# Patient Record
Sex: Male | Born: 1965
Health system: Southern US, Community
[De-identification: ages and names within clinical notes are randomized; demographics above are authoritative.]

## PROBLEM LIST (undated history)

## (undated) DIAGNOSIS — E785 Hyperlipidemia, unspecified: Secondary | ICD-10-CM

## (undated) DIAGNOSIS — M1990 Unspecified osteoarthritis, unspecified site: Secondary | ICD-10-CM

## (undated) DIAGNOSIS — T7840XA Allergy, unspecified, initial encounter: Secondary | ICD-10-CM

## (undated) DIAGNOSIS — K219 Gastro-esophageal reflux disease without esophagitis: Secondary | ICD-10-CM

## (undated) DIAGNOSIS — R51 Headache: Secondary | ICD-10-CM

## (undated) DIAGNOSIS — M21379 Foot drop, unspecified foot: Secondary | ICD-10-CM

## (undated) DIAGNOSIS — F909 Attention-deficit hyperactivity disorder, unspecified type: Secondary | ICD-10-CM

## (undated) HISTORY — DX: Headache: R51

## (undated) HISTORY — DX: Foot drop, unspecified foot: M21.379

## (undated) HISTORY — PX: EYE SURGERY: SHX253

## (undated) HISTORY — DX: Attention-deficit hyperactivity disorder, unspecified type: F90.9

## (undated) HISTORY — PX: FRACTURE SURGERY: SHX138

## (undated) HISTORY — DX: Allergy, unspecified, initial encounter: T78.40XA

## (undated) HISTORY — DX: Hyperlipidemia, unspecified: E78.5

## (undated) HISTORY — DX: Unspecified osteoarthritis, unspecified site: M19.90

## (undated) HISTORY — DX: Gastro-esophageal reflux disease without esophagitis: K21.9

---

## 2004-09-07 ENCOUNTER — Ambulatory Visit: Payer: Self-pay | Admitting: Family Medicine

## 2005-01-24 ENCOUNTER — Ambulatory Visit: Payer: Self-pay | Admitting: Internal Medicine

## 2005-04-18 ENCOUNTER — Ambulatory Visit: Payer: Self-pay | Admitting: Internal Medicine

## 2005-11-27 ENCOUNTER — Ambulatory Visit: Payer: Self-pay | Admitting: Internal Medicine

## 2010-07-22 DIAGNOSIS — M21379 Foot drop, unspecified foot: Secondary | ICD-10-CM

## 2010-07-22 HISTORY — DX: Foot drop, unspecified foot: M21.379

## 2011-04-19 ENCOUNTER — Telehealth: Payer: Self-pay | Admitting: Internal Medicine

## 2011-04-19 ENCOUNTER — Encounter: Payer: Self-pay | Admitting: Internal Medicine

## 2011-04-19 ENCOUNTER — Ambulatory Visit (INDEPENDENT_AMBULATORY_CARE_PROVIDER_SITE_OTHER): Payer: BC Managed Care – PPO | Admitting: Internal Medicine

## 2011-04-19 DIAGNOSIS — Z0000 Encounter for general adult medical examination without abnormal findings: Secondary | ICD-10-CM | POA: Insufficient documentation

## 2011-04-19 DIAGNOSIS — F909 Attention-deficit hyperactivity disorder, unspecified type: Secondary | ICD-10-CM | POA: Insufficient documentation

## 2011-04-19 DIAGNOSIS — Z136 Encounter for screening for cardiovascular disorders: Secondary | ICD-10-CM

## 2011-04-19 NOTE — Patient Instructions (Addendum)
Came back fasting: A1C -- hyperglycemia FLP  --dx v70

## 2011-04-19 NOTE — Assessment & Plan Note (Addendum)
If at some point likes me to RF meds , we will need to get dx documentation

## 2011-04-19 NOTE — Assessment & Plan Note (Addendum)
Td 2012 along w/ many other shots in preparation to a trip to [place] (hep A and B) , yellow fever,  malaria prophylaxis Rx.   Brought labs from 6-12: CBC normal, CBG 112, BMP otherwise normal, LFTs-TSH-vit D--- normal Plan: FLP A1C Brother dx w/  Colon polyps -- refer to GI Diet-exercise discussed EKG wnl

## 2011-04-19 NOTE — Progress Notes (Signed)
  Subjective:    Patient ID: Steven Avery, male    DOB: 1965-09-01, 45 y.o.   MRN: [id number]  HPI CPX New patient, last seen more than 5 years ago. Doing well, separated from wife, had some anxiety issues, went to see a counselor last year, diagnosed with ADD (per pt), prescribed Wellbutrin, doing well.  Past Medical History  Diagnosis Date  . Mild hyperlipidemia   . Foot drop 2012    Left, resolving   . ADD (attention deficit disorder with hyperactivity)     ? of , dx 2011, on wellbutrin   Past Surgical History  Procedure Date  . Fracture surgery     L arm, L ankle (remotely)  . Eye surgery     ?strabismus    History   Social History  . Marital Status: Married    Spouse Name: N/A    Number of Children: 2  . Years of Education: N/A   Occupational History  . engineer     Social History Main Topics  . Smoking status: Never Smoker   . Smokeless tobacco: Never Used  . Alcohol Use: Yes     socially   . Drug Use: No  . Sexually Active: Not on file   Other Topics Concern  . Not on file   Social History Narrative   Separated from wife, 2011( kids 18-20)----Diet: healthy most of the time---exercise: on-off    Family History  Problem Relation Age of Onset  . Diabetes Neg Hx   . Hypertension Neg Hx   . Coronary artery disease Neg Hx   . Colon cancer Neg Hx   . Hyperlipidemia      M  . Prostate cancer Neg Hx   . Colon polyps      brother at age 74      Review of Systems  Respiratory: Negative for cough and shortness of breath.   Cardiovascular: Negative for chest pain and leg swelling.  Genitourinary: Negative for dysuria and hematuria.       Objective:   Physical Exam  Constitutional: He is oriented to person, place, and time. He appears well-developed and well-nourished. No distress.  HENT:  Head: Normocephalic and atraumatic.  Neck: No thyromegaly present.       Nl carotid pulse   Cardiovascular: Normal rate, regular rhythm and normal heart  sounds.   No murmur heard. Pulmonary/Chest: Effort normal and breath sounds normal. No respiratory distress. He has no wheezes. He has no rales.  Abdominal: Soft. Bowel sounds are normal. He exhibits no distension. There is no tenderness. There is no rebound and no guarding.  Musculoskeletal: He exhibits no edema.  Neurological: He is alert and oriented to person, place, and time.  Skin: Skin is warm and dry. He is not diaphoretic.  Psychiatric: He has a normal mood and affect. His behavior is normal. Judgment and thought content normal.          Assessment & Plan:

## 2011-04-19 NOTE — Telephone Encounter (Signed)
Patient has a question about a MMR booster, please call him

## 2011-04-22 ENCOUNTER — Other Ambulatory Visit: Payer: Self-pay | Admitting: Internal Medicine

## 2011-04-22 DIAGNOSIS — Z0000 Encounter for general adult medical examination without abnormal findings: Secondary | ICD-10-CM

## 2011-04-23 ENCOUNTER — Encounter: Payer: Self-pay | Admitting: Gastroenterology

## 2011-04-23 ENCOUNTER — Other Ambulatory Visit (INDEPENDENT_AMBULATORY_CARE_PROVIDER_SITE_OTHER): Payer: BC Managed Care – PPO

## 2011-04-23 DIAGNOSIS — Z0000 Encounter for general adult medical examination without abnormal findings: Secondary | ICD-10-CM

## 2011-04-23 LAB — LIPID PANEL: Cholesterol: 251 mg/dL — ABNORMAL HIGH (ref 0–200)

## 2011-04-23 NOTE — Progress Notes (Signed)
Labs only

## 2011-04-25 LAB — HEMOGLOBIN A1C: Hgb A1c MFr Bld: 5 % (ref 4.6–6.5)

## 2011-05-15 ENCOUNTER — Ambulatory Visit (INDEPENDENT_AMBULATORY_CARE_PROVIDER_SITE_OTHER): Payer: BC Managed Care – PPO | Admitting: Internal Medicine

## 2011-05-15 ENCOUNTER — Encounter: Payer: Self-pay | Admitting: Internal Medicine

## 2011-05-15 VITALS — BP 162/74 | HR 60 | Temp 97.7°F | Ht 72.0 in | Wt 169.4 lb

## 2011-05-15 DIAGNOSIS — J029 Acute pharyngitis, unspecified: Secondary | ICD-10-CM

## 2011-05-15 DIAGNOSIS — J069 Acute upper respiratory infection, unspecified: Secondary | ICD-10-CM

## 2011-05-15 DIAGNOSIS — R05 Cough: Secondary | ICD-10-CM

## 2011-05-15 DIAGNOSIS — R059 Cough, unspecified: Secondary | ICD-10-CM

## 2011-05-15 NOTE — Patient Instructions (Signed)
Rest, fluids , tylenol For cough, take Mucinex DM twice a day as needed  Continue your allergy meds and sinus lavages Call if no better in few days Call anytime if the symptoms are severe

## 2011-05-15 NOTE — Progress Notes (Signed)
  Subjective:    Patient ID: Steven Avery, male    DOB: January 21, 1966, 45 y.o.   MRN: [id number]  HPI Acute visit PN drip x few days ST and cough   x 2 days, worse x 1 day; nose stopped up Tacking allegra, singulair regulalrly, added mucinex since sx started   Past Medical History  Diagnosis Date  . Mild hyperlipidemia   . Foot drop 2012    Left, resolving   . ADD (attention deficit disorder with hyperactivity)     ? of , dx 2011, on wellbutrin   Past Surgical History  Procedure Date  . Fracture surgery     L arm, L ankle (remotely)  . Eye surgery     ?strabismus     Review of Systems No f/c, no n-v-d No myalgias     Objective:   Physical Exam  Constitutional: He appears well-developed and well-nourished. No distress.  HENT:  Head: Normocephalic and atraumatic.  Right Ear: External ear normal.  Left Ear: External ear normal.       Nose slt congested, throat slt red, no d/c, tonsils small  Neck: Normal range of motion. Neck supple.       B small , non tender LADs  Cardiovascular: Normal rate, regular rhythm and normal heart sounds.   No murmur heard. Pulmonary/Chest: Effort normal and breath sounds normal. No respiratory distress. He has no wheezes. He has no rales.  Skin: He is not diaphoretic.          Assessment & Plan:  URI: See instructions Check a strep A , abx if appropiate  (test was negative) States that usually his URIs don't get better w/o abx, pt will call next week and let me know how he is doing

## 2011-05-17 ENCOUNTER — Encounter: Payer: Self-pay | Admitting: Gastroenterology

## 2011-05-17 ENCOUNTER — Ambulatory Visit (AMBULATORY_SURGERY_CENTER): Payer: BC Managed Care – PPO | Admitting: *Deleted

## 2011-05-17 ENCOUNTER — Telehealth: Payer: Self-pay | Admitting: Internal Medicine

## 2011-05-17 ENCOUNTER — Telehealth: Payer: Self-pay | Admitting: *Deleted

## 2011-05-17 VITALS — Ht 71.0 in | Wt 166.9 lb

## 2011-05-17 DIAGNOSIS — Z1211 Encounter for screening for malignant neoplasm of colon: Secondary | ICD-10-CM

## 2011-05-17 MED ORDER — AMOXICILLIN 500 MG PO CAPS: ORAL_CAPSULE | ORAL | Status: DC

## 2011-05-17 MED ORDER — PEG-KCL-NACL-NASULF-NA ASC-C 100 G PO SOLR: ORAL | Status: DC

## 2011-05-17 NOTE — Telephone Encounter (Signed)
Dr. Christella Hartigan-- pt is here for Pv for screening colonoscopy.  He is 45 years old.  Brother at age 75 had sessile polyp; serrated adenoma on path report. Do you agree that pt should have screening colon now? Thanks. Ezra Sites

## 2011-05-17 NOTE — Telephone Encounter (Signed)
Rx called in, patient informed 

## 2011-05-17 NOTE — Telephone Encounter (Signed)
Amoxicillin 500 mg 2 tablets twice a day for one week

## 2011-05-17 NOTE — Telephone Encounter (Signed)
Yes, he should have colon now

## 2011-05-31 ENCOUNTER — Other Ambulatory Visit: Payer: BC Managed Care – PPO | Admitting: Gastroenterology

## 2011-06-10 ENCOUNTER — Other Ambulatory Visit: Payer: BC Managed Care – PPO | Admitting: Gastroenterology

## 2011-09-18 ENCOUNTER — Ambulatory Visit (INDEPENDENT_AMBULATORY_CARE_PROVIDER_SITE_OTHER): Payer: BC Managed Care – PPO | Admitting: Internal Medicine

## 2011-09-18 VITALS — BP 116/78 | HR 93 | Temp 97.8°F | Wt 169.0 lb

## 2011-09-18 DIAGNOSIS — F909 Attention-deficit hyperactivity disorder, unspecified type: Secondary | ICD-10-CM

## 2011-09-18 NOTE — Progress Notes (Signed)
  Subjective:    Patient ID: Steven Avery, male    DOB: 10/12/1965, 46 y.o.   MRN: [id number]  HPI Here at the request of his psychotherapist. He is going to start new ADD medicine and needs a baseline EKG  Past Medical History  Diagnosis Date  . Mild hyperlipidemia   . Foot drop 2012    Left, resolving   . ADD (attention deficit disorder with hyperactivity)     ? of , dx 2011, on wellbutrin  . Sinus infection    Past Surgical History  Procedure Date  . Fracture surgery     L arm, R ankle (remotely)  . Eye surgery     ?strabismus     Review of Systems Denies chest pain or shortness or breath.    Objective:   Physical Exam Alert oriented x3, no apparent distress. Healthy-appearing 46 year old gentleman       Assessment & Plan:  EKG normal, will faxed to his counselor

## 2011-09-19 ENCOUNTER — Encounter: Payer: Self-pay | Admitting: Internal Medicine

## 2011-09-19 NOTE — Assessment & Plan Note (Signed)
ekg wnl , will fax to his therapist

## 2011-09-24 ENCOUNTER — Telehealth: Payer: Self-pay | Admitting: Internal Medicine

## 2011-09-24 NOTE — Telephone Encounter (Signed)
Was faxed on 2.27.13. Will fax again today.

## 2011-09-24 NOTE — Telephone Encounter (Signed)
Patient called & stated EKG results are to faxed to [facility] Fax# [phone] To clear him for [employer] Stimulant cleareance.  Can call patient at [phone]

## 2011-10-15 ENCOUNTER — Other Ambulatory Visit: Payer: Self-pay | Admitting: Otolaryngology

## 2011-10-17 ENCOUNTER — Ambulatory Visit
Admission: RE | Admit: 2011-10-17 | Discharge: 2011-10-17 | Disposition: A | Discharge status: Home / Self Care | Payer: BC Managed Care – PPO | Source: Ambulatory Visit | Attending: Otolaryngology | Admitting: Otolaryngology

## 2011-11-19 ENCOUNTER — Telehealth: Payer: Self-pay | Admitting: Internal Medicine

## 2011-11-19 NOTE — Telephone Encounter (Signed)
Done

## 2011-11-19 NOTE — Telephone Encounter (Signed)
Advise patient: Cholesterol was elevated ~ 6 months ago, rec labs (no need for visit) at his convenience:  FLP-- dx hyperlipidemia

## 2011-11-20 ENCOUNTER — Other Ambulatory Visit: Payer: Self-pay | Admitting: Otolaryngology

## 2011-11-20 HISTORY — PX: NASAL SINUS SURGERY: SHX719

## 2011-11-27 ENCOUNTER — Other Ambulatory Visit (INDEPENDENT_AMBULATORY_CARE_PROVIDER_SITE_OTHER): Payer: BC Managed Care – PPO

## 2011-11-27 DIAGNOSIS — E785 Hyperlipidemia, unspecified: Secondary | ICD-10-CM

## 2011-11-27 LAB — LIPID PANEL
Cholesterol: 241 mg/dL — ABNORMAL HIGH (ref 0–200)
Triglycerides: 95 mg/dL (ref 0.0–149.0)

## 2011-11-27 LAB — LDL CHOLESTEROL, DIRECT: Direct LDL: 187 mg/dL

## 2011-11-27 NOTE — Progress Notes (Signed)
LABS ONLY  

## 2011-11-29 ENCOUNTER — Telehealth: Payer: Self-pay | Admitting: Internal Medicine

## 2011-11-29 DIAGNOSIS — E785 Hyperlipidemia, unspecified: Secondary | ICD-10-CM

## 2011-11-29 NOTE — Telephone Encounter (Signed)
Pt states he is taking methylphenadine 54mg . Ok to send in lipitor 20mg ?

## 2011-11-29 NOTE — Telephone Encounter (Signed)
Advice patient, his cholesterol is not better. LDL 187, goal 130. Recommend to start Lipitor 20 mg however before he is start that I need to know if he is taking any new medicines prescribed by his psychiatrist. Let me know

## 2011-12-02 MED ORDER — ATORVASTATIN CALCIUM 20 MG PO TABS: 20.0000 mg | ORAL_TABLET | Freq: Every day | ORAL | Status: DC

## 2011-12-02 NOTE — Telephone Encounter (Signed)
Okay Lipitor 20 mg one by mouth each bedtime, #30 and 3 refills. FLP, AST, ALT in 6 weeks., dx hyperlipidemia

## 2011-12-02 NOTE — Telephone Encounter (Signed)
Refill done. Orders entered.

## 2012-01-02 ENCOUNTER — Telehealth: Payer: Self-pay | Admitting: Internal Medicine

## 2012-01-02 MED ORDER — ATORVASTATIN CALCIUM 20 MG PO TABS: 20.0000 mg | ORAL_TABLET | Freq: Every day | ORAL | Status: DC

## 2012-01-02 NOTE — Telephone Encounter (Signed)
Refill done.  

## 2012-01-02 NOTE — Telephone Encounter (Signed)
Refill: Atorvastatin calcium tablet 20 mg. 90 day supply

## 2012-01-08 MED ORDER — ATORVASTATIN CALCIUM 20 MG PO TABS: 20.0000 mg | ORAL_TABLET | Freq: Every day | ORAL | Status: DC

## 2012-01-08 NOTE — Addendum Note (Signed)
Addended by: Edwena Felty T on: 01/08/2012 08:19 AM   Modules accepted: Orders

## 2012-04-17 ENCOUNTER — Encounter: Payer: BC Managed Care – PPO | Admitting: Internal Medicine

## 2012-07-22 HISTORY — PX: COLONOSCOPY: SHX174

## 2012-08-24 ENCOUNTER — Ambulatory Visit (INDEPENDENT_AMBULATORY_CARE_PROVIDER_SITE_OTHER): Payer: BC Managed Care – PPO | Admitting: Internal Medicine

## 2012-08-24 ENCOUNTER — Encounter: Payer: Self-pay | Admitting: Internal Medicine

## 2012-08-24 VITALS — BP 126/72 | HR 72 | Temp 98.6°F | Ht 72.0 in | Wt 178.0 lb

## 2012-08-24 DIAGNOSIS — E785 Hyperlipidemia, unspecified: Secondary | ICD-10-CM | POA: Insufficient documentation

## 2012-08-24 DIAGNOSIS — Z0000 Encounter for general adult medical examination without abnormal findings: Secondary | ICD-10-CM

## 2012-08-24 LAB — LIPID PANEL
HDL: 48.3 mg/dL (ref >39.00)
LDL Cholesterol: 97 mg/dL (ref 0–99)
Total CHOL/HDL Ratio: 3
Triglycerides: 121 mg/dL (ref 0.0–149.0)

## 2012-08-24 LAB — COMPREHENSIVE METABOLIC PANEL
ALT: 44 U/L (ref 0–53)
Albumin: 4.2 g/dL (ref 3.5–5.2)
BUN: 16 mg/dL (ref 6–23)
CO2: 27 mEq/L (ref 19–32)
Calcium: 8.9 mg/dL (ref 8.4–10.5)
Chloride: 105 mEq/L (ref 96–112)
Creatinine, Ser: 1.1 mg/dL (ref 0.4–1.5)
GFR: 75.78 mL/min (ref >60.00)

## 2012-08-24 LAB — CBC WITH DIFFERENTIAL/PLATELET
Basophils Absolute: 0 10*3/uL (ref 0.0–0.1)
Hemoglobin: 15.7 g/dL (ref 13.0–17.0)
Lymphocytes Relative: 18.7 % (ref 12.0–46.0)
Monocytes Relative: 9.1 % (ref 3.0–12.0)
Neutro Abs: 7.3 10*3/uL (ref 1.4–7.7)
Neutrophils Relative %: 70.7 % (ref 43.0–77.0)
RDW: 13.5 % (ref 11.5–14.6)

## 2012-08-24 NOTE — Assessment & Plan Note (Addendum)
Td 2012 Labs  Brother dx w/  Colon polyps -- was referred to GI but did not have a cscope, plans to reschedule , will call us if needs a re-referral Diet-exercise discussed

## 2012-08-24 NOTE — Progress Notes (Signed)
  Subjective:    Patient ID: Steven Avery, male    DOB: 04/01/1966, 47 y.o.   MRN: [id number]  HPI Complete physical exam. Feeling well.  Past Medical History  Diagnosis Date  . Mild hyperlipidemia   . Foot drop 2012    Left, resolved  . ADD (attention deficit disorder with hyperactivity)     ? of , dx 2011, on wellbutrin   Past Surgical History  Procedure Date  . Fracture surgery     L arm, R ankle (remotely)  . Eye surgery     ?strabismus   . Nasal sinus surgery 11-2011    for chronic sinusitis   History   Social History  . Marital Status: Legally Separated    Spouse Name: N/A    Number of Children: 2  . Years of Education: N/A   Occupational History  . engineer     Social History Main Topics  . Smoking status: Never Smoker   . Smokeless tobacco: Never Used  . Alcohol Use: Yes     Comment: socially   . Drug Use: No  . Sexually Active: Not on file   Other Topics Concern  . Not on file   Social History Narrative   divorced, 2011, 2 grown kids, going to [place] ----Diet:  healthy most of the time---exercise: not very active    Family History  Problem Relation Age of Onset  . Diabetes Neg Hx   . Hypertension Neg Hx   . Coronary artery disease Neg Hx   . Colon cancer Neg Hx   . Hyperlipidemia      M  . Prostate cancer Neg Hx   . Colon polyps      brother at age 76    Review of Systems No chest pain or shortness or breath No nausea, vomiting, diarrhea or blood in the stools. Denies any anxiety and depression, sleeping well. No dysuria or gross hematuria. Had sinus surgery, allergy-sinus symptoms much improved.    Objective:   Physical Exam General -- alert, well-developed, and well-nourished.   Neck --no thyromegaly , normal carotid pulse Lungs -- normal respiratory effort, no intercostal retractions, no accessory muscle use, and normal breath sounds.   Heart-- normal rate, regular rhythm, no murmur, and no gallop.   Abdomen--soft, non-tender, no  distention, no masses, no HSM, no guarding, and no rigidity.   Extremities-- no pretibial edema bilaterally Neurologic-- alert & oriented X3 and strength normal in all extremities. Psych-- Cognition and judgment appear intact. Alert and cooperative with normal attention span and concentration.  not anxious appearing and not depressed appearing.       Assessment & Plan:

## 2012-08-24 NOTE — Patient Instructions (Addendum)
Next visit in one year as long as you feel well and labs are good. Stay active, eat healthy!

## 2012-08-28 ENCOUNTER — Encounter: Payer: Self-pay | Admitting: *Deleted

## 2012-09-05 ENCOUNTER — Other Ambulatory Visit: Payer: Self-pay

## 2012-10-19 ENCOUNTER — Telehealth: Payer: Self-pay | Admitting: Internal Medicine

## 2012-10-19 MED ORDER — ATORVASTATIN CALCIUM 20 MG PO TABS: 20.0000 mg | ORAL_TABLET | Freq: Every day | ORAL | Status: DC

## 2012-10-19 NOTE — Telephone Encounter (Signed)
Refill:Atorvastatin calcium 20 mg. Take 1 by mouth at bedtime. 90 day supply

## 2012-10-19 NOTE — Telephone Encounter (Signed)
Refill done.  

## 2012-11-11 ENCOUNTER — Ambulatory Visit (INDEPENDENT_AMBULATORY_CARE_PROVIDER_SITE_OTHER): Payer: BC Managed Care – PPO | Admitting: Internal Medicine

## 2012-11-11 VITALS — BP 110/76 | HR 65 | Temp 98.3°F | Wt 184.8 lb

## 2012-11-11 DIAGNOSIS — M7052 Other bursitis of knee, left knee: Secondary | ICD-10-CM

## 2012-11-11 DIAGNOSIS — L740 Miliaria rubra: Secondary | ICD-10-CM

## 2012-11-11 DIAGNOSIS — M76899 Other specified enthesopathies of unspecified lower limb, excluding foot: Secondary | ICD-10-CM

## 2012-11-11 NOTE — Patient Instructions (Addendum)
Use an anti-inflammatory cream such as Aspercreme or Zostrix cream twice a day to the left knee as needed. In lieu of this warm moist compresses or  hot water bottle can be used. Do not apply ice . Review and correct the record as indicated. Please share record with all medical staff seen.

## 2012-11-11 NOTE — Progress Notes (Signed)
  Subjective:    Patient ID: Steven Avery, male    DOB: September 15, 1965, 47 y.o.   MRN: [id number]  HPI  He began to notice some effusion in the left inferior patellar area 2-3 weeks ago. He believes that this began after he hikes 3-5 miles up an incline.  It was tender without significant pain. He feels it did decrease in size possibly related to nonsteroidals.  It did recur  without any subsequent trauma or trigger. He's been applying ice at night and using NSAIDS without significant benefit.  In 1983 he was diagnosed as having a calcium deposit in the tendon of the left knee. After trauma to this area there was a second evaluation for apparent osteoid formation in 1988  A secondary issue is an intermittent, non pruritic "acne" rash of the thorax greater anteriorly than posteriorly for 2-3 months. Baby powder has not been significant benefit .    Review of Systems   Constitutional: no fever, chills, sweats, change in weight  Musculoskeletal:no  muscle cramps or pain; no  joint stiffness or redness Neuro: no weakness; numbness and tingling Heme:no lymphadenopathy; abnormal bruising or bleeding  Allergy:no significant itchy, watery eyes or sneezing                                                                                    Objective:   Physical Exam Gen.: Healthy and well-nourished in appearance. Alert, appropriate and cooperative throughout exam.  Neck: No deformities, masses, or tenderness noted. Range of motion normal.                           Musculoskeletal/extremities: No deformity or scoliosis noted of  the thoracic or lumbar spine. No clubbing, cyanosis, edema, or significant extremity  deformity noted. Range of motion normal .Tone & strength  normal.Joints normal . Nail health good. Able to lie down & sit up w/o help. Negative SLR bilaterally. There is no patellar effusion but the bursa at the left inferior patella appears to be swollen. It is nontender. Neurologic:  Alert and oriented x3. Deep tendon reflexes symmetrical and normal.  Skin: Intact without suspicious lesions or rashes.Over the anterior and posterior thorax there are scattered punctate erythematous lesions which blanch with pressure. There is no change in temperature or color at the left knee.  Lymph: No cervical, axillary lymphadenopathy present. Psych: Mood and affect are  normal. Normally interactive           Assessment & Plan:   #1 left patellar bursitis  #2 dilated capillaries suggestive of possible heat rash. No evidence clinically of cellulitis or acne.  Plan: Orthopedic assessment of the bursitis is suggested as this has become a chronic issue and he is physically active.

## 2012-12-16 ENCOUNTER — Other Ambulatory Visit: Payer: Self-pay | Admitting: *Deleted

## 2012-12-16 MED ORDER — ATORVASTATIN CALCIUM 20 MG PO TABS: 20.0000 mg | ORAL_TABLET | Freq: Every day | ORAL | Status: DC

## 2012-12-16 NOTE — Telephone Encounter (Signed)
Rx sent 

## 2013-01-05 ENCOUNTER — Encounter: Payer: Self-pay | Admitting: Gastroenterology

## 2013-01-20 ENCOUNTER — Encounter: Payer: Self-pay | Admitting: Gastroenterology

## 2013-01-20 ENCOUNTER — Ambulatory Visit (AMBULATORY_SURGERY_CENTER): Payer: BC Managed Care – PPO | Admitting: *Deleted

## 2013-01-20 VITALS — Ht 71.0 in | Wt 184.0 lb

## 2013-01-20 DIAGNOSIS — Z1211 Encounter for screening for malignant neoplasm of colon: Secondary | ICD-10-CM

## 2013-01-20 MED ORDER — MOVIPREP 100 G PO SOLR: ORAL | Status: DC

## 2013-01-20 NOTE — Progress Notes (Signed)
Patient already filled the MoviPrep and states that the expiration date is for 2015. Explained to patient he could use that with our instructions. Patient verbalizes understanding. Did not send rx to pharmacy.

## 2013-02-03 ENCOUNTER — Encounter: Payer: Self-pay | Admitting: Gastroenterology

## 2013-02-03 ENCOUNTER — Ambulatory Visit (AMBULATORY_SURGERY_CENTER): Payer: BC Managed Care – PPO | Admitting: Gastroenterology

## 2013-02-03 VITALS — BP 101/74 | HR 69 | Temp 98.4°F | Resp 14 | Ht 71.0 in | Wt 184.0 lb

## 2013-02-03 DIAGNOSIS — Z1211 Encounter for screening for malignant neoplasm of colon: Secondary | ICD-10-CM

## 2013-02-03 DIAGNOSIS — D126 Benign neoplasm of colon, unspecified: Secondary | ICD-10-CM

## 2013-02-03 MED ORDER — SODIUM CHLORIDE 0.9 % IV SOLN: 500.0000 mL | INTRAVENOUS | Status: DC

## 2013-02-03 NOTE — Progress Notes (Signed)
Patient did not experience any of the following events: a burn prior to discharge; a fall within the facility; wrong site/side/patient/procedure/implant event; or a hospital transfer or hospital admission upon discharge from the facility. (G8907) Patient did not have preoperative order for IV antibiotic SSI prophylaxis. (G8918)  

## 2013-02-03 NOTE — Op Note (Signed)
[facility] [address] [place], [address]   COLONOSCOPY PROCEDURE REPORT  PATIENT: Steven Avery, Steven Avery.  MR#: [id number] BIRTHDATE: Oct 20, 1965 , 46  yrs. old GENDER: Male ENDOSCOPIST: Rachael Fee, MD REFERRED XB:JYNW Drue Novel, M.D. PROCEDURE DATE:  02/03/2013 PROCEDURE:   Colonoscopy with snare polypectomy ASA CLASS:   Class II INDICATIONS:elevated risk screening and Brother with colon polyps.  MEDICATIONS: Fentanyl 75 mcg IV, Versed 8 mg IV, and These medications were titrated to patient response per physician's verbal order  DESCRIPTION OF PROCEDURE:   After the risks benefits and alternatives of the procedure were thoroughly explained, informed consent was obtained.  A digital rectal exam revealed no abnormalities of the rectum.   The LB [id number]  endoscope was introduced through the anus and advanced to the cecum, which was identified by both the appendix and ileocecal valve. No adverse events experienced.   The quality of the prep was good.  The instrument was then slowly withdrawn as the colon was fully examined.   COLON FINDINGS: One polyp was found, removed and sent to pathology. This was 4mm across, sessile, located in transverse segment, removed with cold snare.  The examination was otherwise normal. Retroflexed views revealed no abnormalities. The time to cecum=4 minutes 27 seconds.  Withdrawal time=7 minutes 56 seconds.  The scope was withdrawn and the procedure completed. COMPLICATIONS: There were no complications.  ENDOSCOPIC IMPRESSION: One polyp was found, removed and sent to pathology. The examination was otherwise normal.  RECOMMENDATIONS: If the polyp(s) removed today are proven to be adenomatous (pre-cancerous) polyps, you will need a repeat colonoscopy in 5 years.  Otherwise you should continue to follow colorectal cancer screening guidelines for "routine risk" patients with colonoscopy in 10 years.  You will receive a  letter within 1-2 weeks with the results of your biopsy as well as final recommendations.  Please call my office if you have not received a letter after 3 weeks.   eSigned:  Rachael Fee, MD 02/03/2013 2:21 PM

## 2013-02-03 NOTE — Patient Instructions (Signed)
YOU HAD AN ENDOSCOPIC PROCEDURE TODAY AT THE [facility]: Refer to the procedure report that was given to you for any specific questions about what was found during the examination.  If the procedure report does not answer your questions, please call your gastroenterologist to clarify.  If you requested that your care partner not be given the details of your procedure findings, then the procedure report has been included in a sealed envelope for you to review at your convenience later.  YOU SHOULD EXPECT: Some feelings of bloating in the abdomen. Passage of more gas than usual.  Walking can help get rid of the air that was put into your GI tract during the procedure and reduce the bloating. If you had a lower endoscopy (such as a colonoscopy or flexible sigmoidoscopy) you may notice spotting of blood in your stool or on the toilet paper. If you underwent a bowel prep for your procedure, then you may not have a normal bowel movement for a few days.  DIET: Your first meal following the procedure should be a light meal and then it is ok to progress to your normal diet.  A half-sandwich or bowl of soup is an example of a good first meal.  Heavy or fried foods are harder to digest and may make you feel nauseous or bloated.  Likewise meals heavy in dairy and vegetables can cause extra gas to form and this can also increase the bloating.  Drink plenty of fluids but you should avoid alcoholic beverages for 24 hours.  ACTIVITY: Your care partner should take you home directly after the procedure.  You should plan to take it easy, moving slowly for the rest of the day.  You can resume normal activity the day after the procedure however you should NOT DRIVE or use heavy machinery for 24 hours (because of the sedation medicines used during the test).    SYMPTOMS TO REPORT IMMEDIATELY: A gastroenterologist can be reached at any hour.  During normal business hours, 8:30 AM to 5:00 PM Monday through Friday,  call [phone].  After hours and on weekends, please call the GI answering service at [phone] who will take a message and have the physician on call contact you.   Following lower endoscopy (colonoscopy or flexible sigmoidoscopy):  Excessive amounts of blood in the stool  Significant tenderness or worsening of abdominal pains  Swelling of the abdomen that is new, acute  Fever of 100F or higher    FOLLOW UP: If any biopsies were taken you will be contacted by phone or by letter within the next 1-3 weeks.  Call your gastroenterologist if you have not heard about the biopsies in 3 weeks.  Our staff will call the home number listed on your records the next business day following your procedure to check on you and address any questions or concerns that you may have at that time regarding the information given to you following your procedure. This is a courtesy call and so if there is no answer at the home number and we have not heard from you through the emergency physician on call, we will assume that you have returned to your regular daily activities without incident.  SIGNATURES/CONFIDENTIALITY: You and/or your care partner have signed paperwork which will be entered into your electronic medical record.  These signatures attest to the fact that that the information above on your After Visit Summary has been reviewed and is understood.  Full responsibility of the confidentiality   of this discharge information lies with you and/or your care-partner.    Information on polyps given to you today 

## 2013-02-04 ENCOUNTER — Telehealth: Payer: Self-pay | Admitting: *Deleted

## 2013-02-04 NOTE — Telephone Encounter (Signed)
  Follow up Call-  Call back number 02/03/2013  Post procedure Call Back phone  # [phone]  Permission to leave phone message Yes     Patient questions:  Message left to call if necessary.

## 2013-02-15 ENCOUNTER — Encounter: Payer: Self-pay | Admitting: Gastroenterology

## 2013-04-21 ENCOUNTER — Other Ambulatory Visit: Payer: Self-pay | Admitting: *Deleted

## 2013-04-21 DIAGNOSIS — E785 Hyperlipidemia, unspecified: Secondary | ICD-10-CM

## 2013-04-21 MED ORDER — ATORVASTATIN CALCIUM 20 MG PO TABS: 20.0000 mg | ORAL_TABLET | Freq: Every day | ORAL | Status: DC

## 2013-04-21 NOTE — Telephone Encounter (Signed)
Lipitor refill sent to [facility]

## 2013-05-27 ENCOUNTER — Other Ambulatory Visit: Payer: Self-pay

## 2013-06-06 IMAGING — CT CT PARANASAL SINUSES LIMITED
1 of 2 series · 10 of 13 positions shown, 13 images · non-contrast
Comparison: None

CLINICAL DATA: Chronic sinusitis, sore throat with congestion.

CT LIMITED SINUSES WITHOUT CONTRAST
TECHNIQUE: Multidetector CT images of the paranasal sinuses were
obtained in a single plane without contrast.

[Series 3: coronal soft · axial · 0.29mm/px · z∈[+27,+117]mm · 10 of 12 slices shown, 13 images]
[im 2/12  brain]
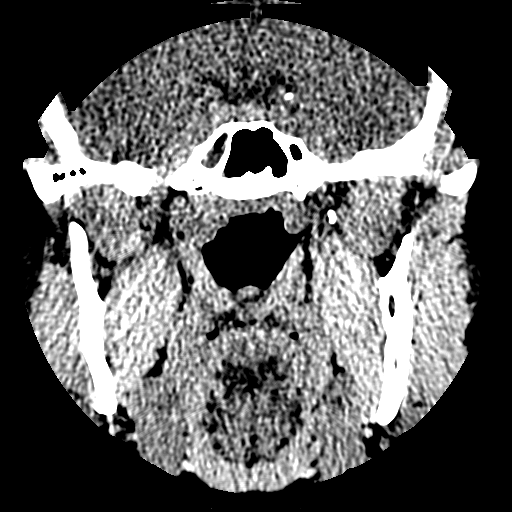
[im 2/12  bone]
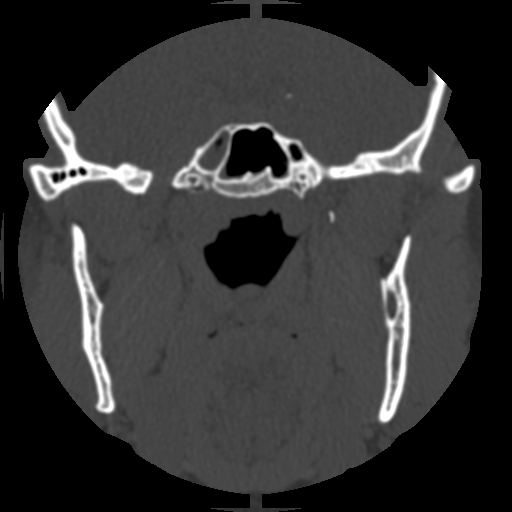
[im 3/12  bone]
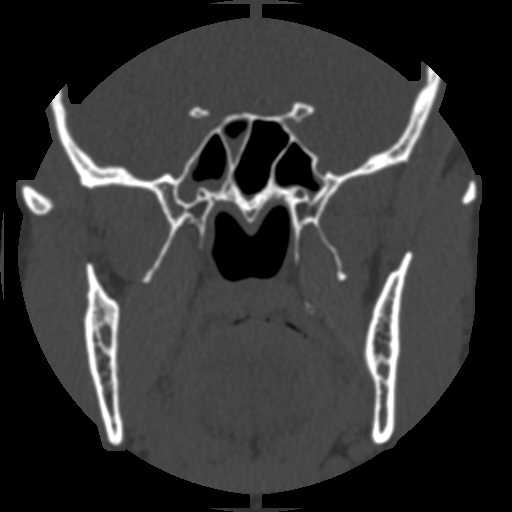
[im 4/12  bone]
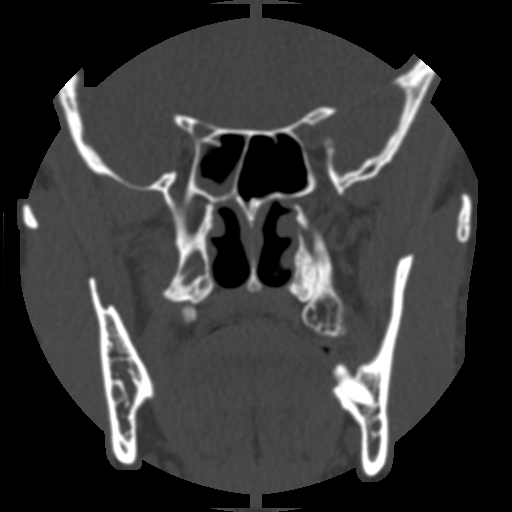
[im 5/12  bone]
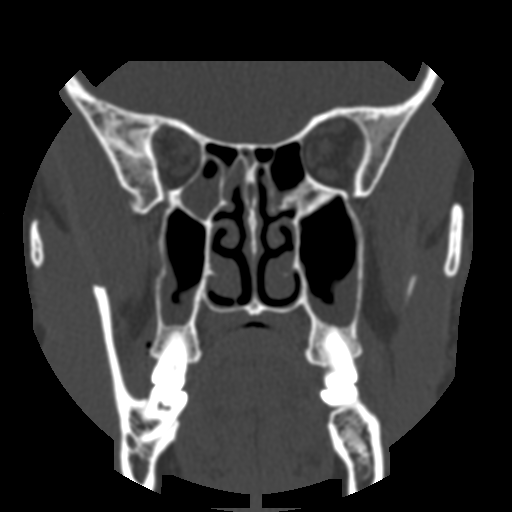
[im 6/12  brain]
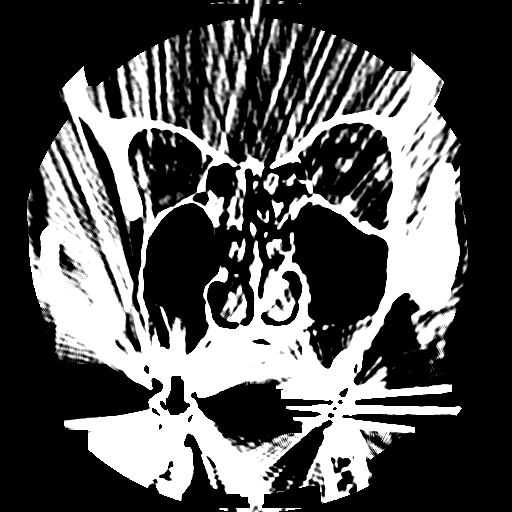
[im 6/12  bone]
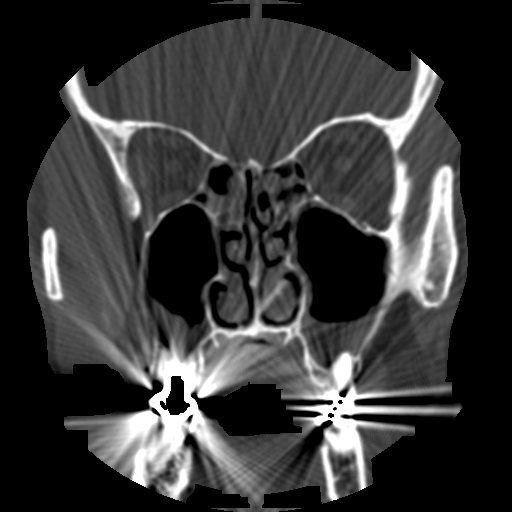
[im 7/12  bone]
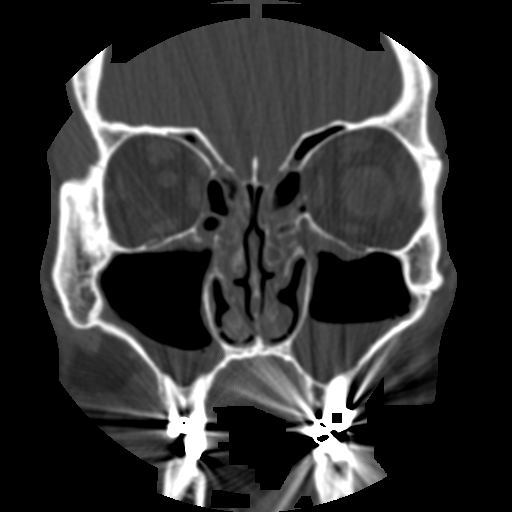
[im 8/12  bone]
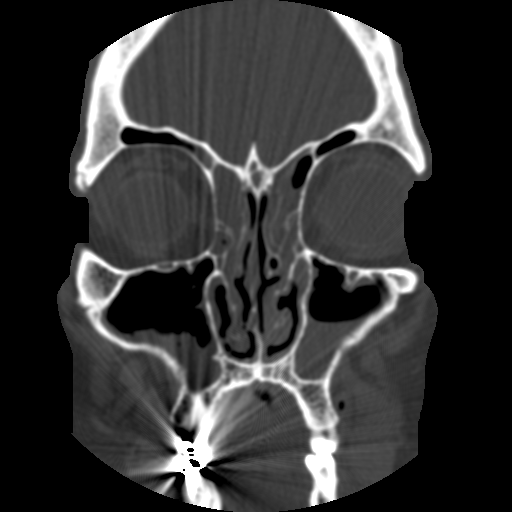
[im 9/12  bone]
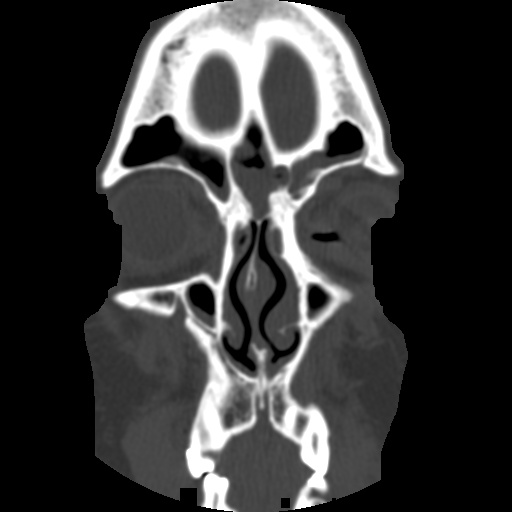
[im 10/12  brain]
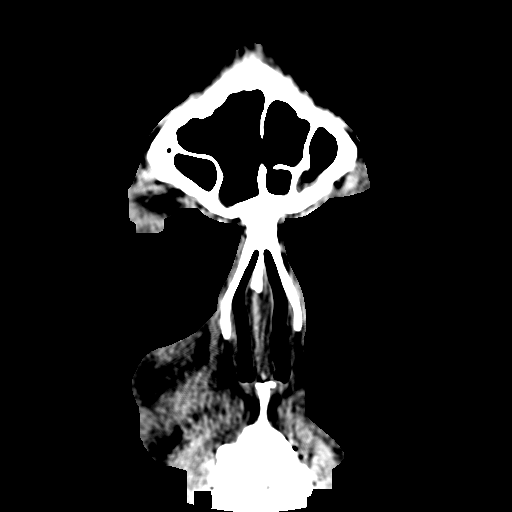
[im 10/12  bone]
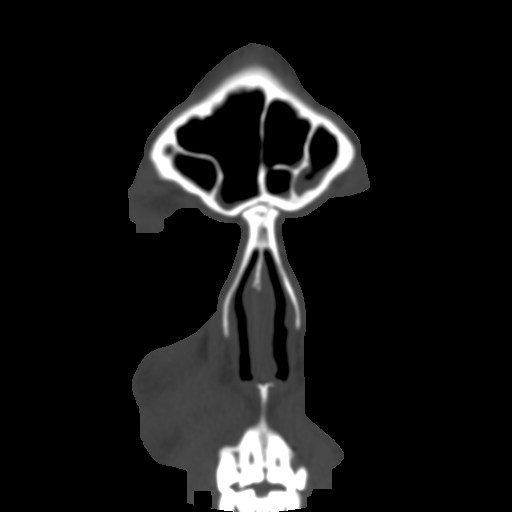
[im 11/12  bone]
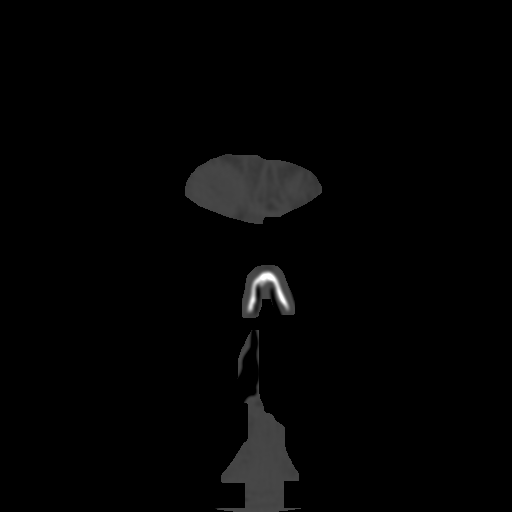

[10 of 13 positions shown; findings below may reference images not displayed]

FINDINGS: Mucosal thickening sphenoid sinus, left division.

Mucosal thickening both frontal sinuses, with slight air-fluid
level on the right and centrally.

Opacification of most of the ethmoid air cells with air-fluid
levels.

Mucosal thickening both maxillary sinuses, with moderate air-fluid
levels, greater on the right.

Nasal septum slightly deviated to the left approximately 2-3 mm.
No osseous destruction.

Visualized intracranial compartment grossly negative.
IMPRESSION: Chronic and acute sinusitis as described.

## 2013-10-12 ENCOUNTER — Other Ambulatory Visit: Payer: Self-pay | Admitting: Internal Medicine

## 2014-01-25 ENCOUNTER — Other Ambulatory Visit: Payer: Self-pay | Admitting: Internal Medicine

## 2014-05-05 ENCOUNTER — Other Ambulatory Visit: Payer: Self-pay | Admitting: Internal Medicine

## 2014-05-06 ENCOUNTER — Telehealth: Payer: Self-pay | Admitting: Internal Medicine

## 2014-05-06 MED ORDER — ATORVASTATIN CALCIUM 20 MG PO TABS: ORAL_TABLET | ORAL | Status: DC

## 2014-05-06 NOTE — Telephone Encounter (Signed)
Please inform Pt that medication has been refilled this time, however, he MUST make an appt for any further refills, he has not been seen by Dr. Larose Kells since 08/2012, CPE if possible, if not F/U visit will be fine for now.   Thanks

## 2014-05-06 NOTE — Addendum Note (Signed)
Addended by: Janalee Dane C on: 05/06/2014 11:02 AM   Modules accepted: Orders

## 2014-05-06 NOTE — Telephone Encounter (Signed)
Caller name:Eiben, Kerrion Kemppainen Relation to pt: self  Call back number: [phone] Pharmacy: [facility] [place], [place] [phone]    Reason for call:   pt requesting a refill atorvastatin (LIPITOR) 20 MG tablet

## 2014-08-30 ENCOUNTER — Encounter: Payer: Self-pay | Admitting: Internal Medicine

## 2014-08-30 ENCOUNTER — Ambulatory Visit (INDEPENDENT_AMBULATORY_CARE_PROVIDER_SITE_OTHER): Payer: 59 | Admitting: Internal Medicine

## 2014-08-30 VITALS — BP 124/78 | HR 64 | Temp 97.9°F | Ht 71.0 in | Wt 181.4 lb

## 2014-08-30 DIAGNOSIS — E785 Hyperlipidemia, unspecified: Secondary | ICD-10-CM

## 2014-08-30 DIAGNOSIS — R519 Headache, unspecified: Secondary | ICD-10-CM

## 2014-08-30 DIAGNOSIS — G4489 Other headache syndrome: Secondary | ICD-10-CM

## 2014-08-30 DIAGNOSIS — G43909 Migraine, unspecified, not intractable, without status migrainosus: Secondary | ICD-10-CM | POA: Insufficient documentation

## 2014-08-30 HISTORY — DX: Headache, unspecified: R51.9

## 2014-08-30 LAB — ALT: ALT: 41 U/L (ref 0–53)

## 2014-08-30 LAB — AST: AST: 28 U/L (ref 0–37)

## 2014-08-30 MED ORDER — ATORVASTATIN CALCIUM 20 MG PO TABS: ORAL_TABLET | ORAL | Status: DC

## 2014-08-30 NOTE — Assessment & Plan Note (Signed)
Has been taking Lipitor consistently except for the last 2 or 3 days, he is not fasting today. We agreed that we will refill Lipitor, will check LFTs today and he will come back in May fasting for a physical. Will get a FLP at that time.

## 2014-08-30 NOTE — Progress Notes (Signed)
Subjective:    Patient ID: Steven Avery, male    DOB: 31-Aug-1965, 49 y.o.   MRN: [id number]  DOS:  08/30/2014 Type of visit - description : Routine visit Interval history: Last visit in 2014, has been taking Lipitor regularly except in the last 2 or 3 days. No apparent side effects. Also has a history of headaches, last episode was 3 days ago, was moderate to intense, initially global then right-sided, associated with GERD type of symptoms but no actual nausea or vomiting. Symptoms resolved after Tylenol. Headaches are no different from previous ones, they usually come every few months or even every few years.    Review of Systems No chest pain or difficulty breathing No nausea, vomiting, diarrhea. No unusual aches and pains.   Past Medical History  Diagnosis Date  . Mild hyperlipidemia   . Foot drop 2012    Left, resolved  . ADD (attention deficit disorder with hyperactivity)     ? of , dx 2011, on wellbutrin  . Headache 08/30/2014    Past Surgical History  Procedure Laterality Date  . Fracture surgery      L arm, R ankle (remotely)  . Eye surgery      ?strabismus   . Nasal sinus surgery  11-2011    for chronic sinusitis    History   Social History  . Marital Status: Legally Separated    Spouse Name: N/A    Number of Children: 2  . Years of Education: N/A   Occupational History  . engineer     Social History Main Topics  . Smoking status: Never Smoker   . Smokeless tobacco: Never Used  . Alcohol Use: 2.4 oz/week    4 Cans of beer per week     Comment: socially   . Drug Use: No  . Sexual Activity: Not on file   Other Topics Concern  . Not on file   Social History Narrative   divorced, 2011, lives by himself   2 grown kids, one lives in [place], works for [employer]. The other is at [employer]           Medication List       This list is accurate as of: 08/30/14  6:46 PM.  Always use your most recent med list.               acetaminophen 325 MG  tablet  Commonly known as:  TYLENOL  Take 650 mg by mouth every 6 (six) hours as needed for pain.     atorvastatin 20 MG tablet  Commonly known as:  LIPITOR  1 tab by mouth daily.     CONCERTA PO  Take 1 tablet by mouth daily.     Fish Oil 1000 MG Caps  Take 1 capsule by mouth daily.     ibuprofen 200 MG tablet  Commonly known as:  ADVIL,MOTRIN  Take 200 mg by mouth every 6 (six) hours as needed for pain.     multivitamin tablet  Take 1 tablet by mouth daily.           Objective:   Physical Exam  Constitutional: He is oriented to person, place, and time. He appears well-developed. No distress.  HENT:  Head: Normocephalic and atraumatic.  Eyes: Conjunctivae and EOM are normal. Pupils are equal, round, and reactive to light. Right eye exhibits no discharge. Left eye exhibits no discharge.  Cardiovascular:  RRR, no murmur, rub or gallop  Pulmonary/Chest: Effort normal.  No respiratory distress.  CTA B  Musculoskeletal: Normal range of motion. He exhibits no edema or tenderness.  Neurological: He is alert and oriented to person, place, and time. He displays normal reflexes. No cranial nerve deficit. He exhibits normal muscle tone. Coordination normal.  Speech normal, gait unassisted and normal for age, motor strength appropriate for age   Skin: Skin is warm and dry. No pallor.  Psychiatric: He has a normal mood and affect. His behavior is normal. Judgment and thought content normal.  Vitals reviewed.       Assessment & Plan:   Problem List Items Addressed This Visit      Other   Mild hyperlipidemia - Primary    Has been taking Lipitor consistently except for the last 2 or 3 days, he is not fasting today. We agreed that we will refill Lipitor, will check LFTs today and he will come back in May fasting for a physical. Will get a FLP at that time.      Relevant Medications   atorvastatin (LIPITOR) tablet   Other Relevant Orders   AST (Completed)   ALT (Completed)    Headache    Long history of a sporadic headaches, last episode 3 days ago, resolved with OTCs. The patient states this was not the worse headache of his life and is not really different from previous headaches. Tensional? Migraines? As long as the headaches are sporadic, respond to OTCs and no different than previous ones is okay to prescribe observation

## 2014-08-30 NOTE — Patient Instructions (Addendum)
Get your blood work before you leave    See you in  May 2016  for a physical exam. Come back fasting

## 2014-08-30 NOTE — Assessment & Plan Note (Signed)
Long history of a sporadic headaches, last episode 3 days ago, resolved with OTCs. The patient states this was not the worse headache of his life and is not really different from previous headaches. Tensional? Migraines? As long as the headaches are sporadic, respond to OTCs and no different than previous ones is okay to prescribe observation

## 2014-08-30 NOTE — Progress Notes (Signed)
Pre visit review using our clinic review tool, if applicable. No additional management support is needed unless otherwise documented below in the visit note. 

## 2014-08-31 ENCOUNTER — Telehealth: Payer: Self-pay | Admitting: Internal Medicine

## 2014-08-31 MED ORDER — ATORVASTATIN CALCIUM 20 MG PO TABS: ORAL_TABLET | ORAL | Status: DC

## 2014-08-31 NOTE — Addendum Note (Signed)
Addended by: Wilfrid Lund on: 08/31/2014 04:51 PM   Modules accepted: Orders

## 2014-08-31 NOTE — Telephone Encounter (Signed)
Caller name: Yordy, [place] Relation to pt: self  Call back number: [phone] Pharmacy: [facility]   Reason for call:  Pt requesting atorvastatin (LIPITOR) 20 MG tablet please send to [facility]

## 2014-08-31 NOTE — Telephone Encounter (Signed)
Atorvastatin refilled to [facility].

## 2014-09-06 ENCOUNTER — Other Ambulatory Visit: Payer: Self-pay

## 2014-09-06 MED ORDER — ATORVASTATIN CALCIUM 20 MG PO TABS: ORAL_TABLET | ORAL | Status: DC

## 2014-11-08 ENCOUNTER — Telehealth: Payer: Self-pay | Admitting: Internal Medicine

## 2014-11-08 NOTE — Telephone Encounter (Signed)
Pre Visit letter sent  °

## 2014-11-28 ENCOUNTER — Telehealth: Payer: Self-pay | Admitting: *Deleted

## 2014-11-28 NOTE — Telephone Encounter (Signed)
Unable to reach patient at time of Pre-Visit Call.  Left message for patient to return call when available.    

## 2014-11-29 ENCOUNTER — Ambulatory Visit (INDEPENDENT_AMBULATORY_CARE_PROVIDER_SITE_OTHER): Payer: 59 | Admitting: Internal Medicine

## 2014-11-29 ENCOUNTER — Encounter: Payer: Self-pay | Admitting: Internal Medicine

## 2014-11-29 VITALS — BP 122/64 | HR 69 | Temp 98.5°F | Ht 71.0 in | Wt 174.0 lb

## 2014-11-29 DIAGNOSIS — Z0000 Encounter for general adult medical examination without abnormal findings: Secondary | ICD-10-CM

## 2014-11-29 LAB — COMPREHENSIVE METABOLIC PANEL
ALT: 29 U/L (ref 0–53)
AST: 28 U/L (ref 0–37)
Albumin: 4.2 g/dL (ref 3.5–5.2)
Alkaline Phosphatase: 65 U/L (ref 39–117)
BUN: 15 mg/dL (ref 6–23)
CO2: 33 meq/L — AB (ref 19–32)
Calcium: 9.6 mg/dL (ref 8.4–10.5)
Chloride: 101 mEq/L (ref 96–112)
Creatinine, Ser: 1.04 mg/dL (ref 0.40–1.50)
GFR: 80.1 mL/min (ref >60.00)
GLUCOSE: 86 mg/dL (ref 70–99)
POTASSIUM: 4.9 meq/L (ref 3.5–5.1)
Sodium: 140 mEq/L (ref 135–145)
Total Bilirubin: 0.7 mg/dL (ref 0.2–1.2)
Total Protein: 7.1 g/dL (ref 6.0–8.3)

## 2014-11-29 LAB — CBC WITH DIFFERENTIAL/PLATELET
BASOS ABS: 0 10*3/uL (ref 0.0–0.1)
Basophils Relative: 0.3 % (ref 0.0–3.0)
Eosinophils Absolute: 0.1 10*3/uL (ref 0.0–0.7)
Eosinophils Relative: 0.9 % (ref 0.0–5.0)
HEMATOCRIT: 47.3 % (ref 39.0–52.0)
HEMOGLOBIN: 16 g/dL (ref 13.0–17.0)
LYMPHS ABS: 3 10*3/uL (ref 0.7–4.0)
Lymphocytes Relative: 29.9 % (ref 12.0–46.0)
MCHC: 33.8 g/dL (ref 30.0–36.0)
MCV: 87.8 fl (ref 78.0–100.0)
MONO ABS: 1 10*3/uL (ref 0.1–1.0)
MONOS PCT: 9.4 % (ref 3.0–12.0)
Neutro Abs: 6.1 10*3/uL (ref 1.4–7.7)
Neutrophils Relative %: 59.5 % (ref 43.0–77.0)
Platelets: 287 10*3/uL (ref 150.0–400.0)
RBC: 5.39 Mil/uL (ref 4.22–5.81)
RDW: 14.3 % (ref 11.5–15.5)
WBC: 10.2 10*3/uL (ref 4.0–10.5)

## 2014-11-29 LAB — LIPID PANEL
CHOL/HDL RATIO: 4
CHOLESTEROL: 210 mg/dL — AB (ref 0–200)
HDL: 52.6 mg/dL (ref >39.00)
LDL Cholesterol: 129 mg/dL — ABNORMAL HIGH (ref 0–99)
NONHDL: 157.4
Triglycerides: 141 mg/dL (ref 0.0–149.0)
VLDL: 28.2 mg/dL (ref 0.0–40.0)

## 2014-11-29 LAB — PSA: PSA: 1.36 ng/mL (ref 0.10–4.00)

## 2014-11-29 LAB — TSH: TSH: 2.11 u[IU]/mL (ref 0.35–4.50)

## 2014-11-29 MED ORDER — ATORVASTATIN CALCIUM 20 MG PO TABS: 20.0000 mg | ORAL_TABLET | Freq: Every day | ORAL | Status: DC

## 2014-11-29 NOTE — Patient Instructions (Signed)
Get your blood work before you leave    Come back to the office in 1 year   for a physical exam  Please schedule an appointment at the front desk    Come back fasting

## 2014-11-29 NOTE — Assessment & Plan Note (Addendum)
Td 2012 Labs  Brother dx w/  Colon polyps  cscope 01-2013, 1 polyp, next 5 years, Dr Ardis Hughs Diet-exercise doing welll   Other issues: High cholesterol, on Lipitor, check FLP, refill meds Headaches, no  unusual headaches ADD, takes Concerta sporadically

## 2014-11-29 NOTE — Progress Notes (Signed)
Subjective:    Patient ID: Steven Avery, male    DOB: [birth year], 49 y.o.   MRN: [id number]  DOS:  11/29/2014 Type of visit - description : cpx Interval history: Doing well, active, take long walks 3 times a week, trying to eat healthy.   Review of Systems  Constitutional: No fever, chills. No unexplained wt changes. No unusual sweats HEENT: No dental problems, ear discharge, facial swelling, voice changes. No eye discharge, redness or intolerance to light Respiratory: No wheezing or difficulty breathing. No cough , mucus production Cardiovascular: No CP, leg swelling or palpitations GI: no nausea, vomiting, diarrhea or abdominal pain.  No blood in the stools. No dysphagia   Endocrine: No polyphagia, polyuria or polydipsia GU: No dysuria, gross hematuria, difficulty urinating. No urinary urgency or frequency. Musculoskeletal: No joint swellings or unusual aches or pains Skin: No change in the color of the skin, palor or rash Allergic, immunologic: No environmental allergies or food allergies Neurological: No dizziness or syncope. No headaches. No diplopia, slurred speech, motor deficits, facial numbness. Denies unusual headaches Hematological: No enlarged lymph nodes, easy bruising or bleeding Psychiatry: No suicidal ideas, hallucinations, behavior problems or confusion. No unusual/severe anxiety or depression.    Past Medical History  Diagnosis Date  . Mild hyperlipidemia   . Foot drop 2012    Left, resolved  . ADD (attention deficit disorder with hyperactivity)     ? of , dx 2011, on wellbutrin  . Headache 08/30/2014    Past Surgical History  Procedure Laterality Date  . Fracture surgery      L arm, R ankle (remotely)  . Eye surgery      ?strabismus   . Nasal sinus surgery  11-2011    for chronic sinusitis    History   Social History  . Marital Status: Legally Separated    Spouse Name: N/A  . Number of Children: 2  . Years of Education: N/A   Occupational  History  . engineer     Social History Main Topics  . Smoking status: Never Smoker   . Smokeless tobacco: Never Used  . Alcohol Use: 2.4 oz/week    4 Cans of beer per week     Comment: socially   . Drug Use: No  . Sexual Activity: Not on file   Other Topics Concern  . Not on file   Social History Narrative   divorced, 2011, lives by himself   2 grown kids, one lives in [place], works for [employer]. The other is at [employer] History  Problem Relation Age of Onset  . Diabetes Neg Hx   . Hypertension Neg Hx   . Coronary artery disease Neg Hx   . Colon cancer Neg Hx   . Hyperlipidemia Mother     M  . Prostate cancer Neg Hx   . Colon polyps Brother     brother at age 69       Medication List       This list is accurate as of: 11/29/14  5:56 PM.  Always use your most recent med list.               acetaminophen 325 MG tablet  Commonly known as:  TYLENOL  Take 650 mg by mouth every 6 (six) hours as needed for pain.     atorvastatin 20 MG tablet  Commonly known as:  LIPITOR  Take 1 tablet (20 mg total) by  mouth daily.     CONCERTA PO  Take 36 mg by mouth daily as needed.     Fish Oil 1000 MG Caps  Take 1 capsule by mouth daily.     ibuprofen 200 MG tablet  Commonly known as:  ADVIL,MOTRIN  Take 200 mg by mouth every 6 (six) hours as needed for pain.     multivitamin tablet  Take 1 tablet by mouth daily.           Objective:   Physical Exam BP 122/64 mmHg  Pulse 69  Temp(Src) 98.5 F (36.9 C) (Oral)  Ht 5\' 11"  (1.803 m)  Wt 174 lb (78.926 kg)  BMI 24.28 kg/m2  SpO2 97% General:   Well developed, well nourished . NAD.  Neck:  Full range of motion. Supple. No  thyromegaly , normal carotid pulse HEENT:  Normocephalic . Face symmetric, atraumatic Lungs:  CTA B Normal respiratory effort, no intercostal retractions, no accessory muscle use. Heart: RRR,  no murmur.  No pretibial edema bilaterally  Abdomen:  Not distended, soft,  non-tender. No rebound or rigidity. No mass,organomegaly Rectal:  External abnormalities: none. Normal sphincter tone. No rectal masses or tenderness.  Stool brown  Prostate: Prostate gland firm and smooth, no enlargement, nodularity, tenderness, mass, asymmetry or induration.  Skin: Exposed areas without rash. Not pale. Not jaundice Neurologic:  alert & oriented X3.  Speech normal, gait appropriate for age and unassisted Strength symmetric and appropriate for age.  Psych: Cognition and judgment appear intact.  Cooperative with normal attention span and concentration.  Behavior appropriate. No anxious or depressed appearing.       Assessment & Plan:   cmp psa cbc tsh flp

## 2014-11-29 NOTE — Progress Notes (Signed)
Pre visit review using our clinic review tool, if applicable. No additional management support is needed unless otherwise documented below in the visit note. 

## 2015-04-28 ENCOUNTER — Ambulatory Visit (INDEPENDENT_AMBULATORY_CARE_PROVIDER_SITE_OTHER): Payer: 59 | Admitting: Internal Medicine

## 2015-04-28 ENCOUNTER — Encounter: Payer: Self-pay | Admitting: Internal Medicine

## 2015-04-28 VITALS — BP 114/74 | HR 61 | Temp 97.6°F | Ht 71.0 in | Wt 175.4 lb

## 2015-04-28 DIAGNOSIS — K644 Residual hemorrhoidal skin tags: Secondary | ICD-10-CM

## 2015-04-28 DIAGNOSIS — Z09 Encounter for follow-up examination after completed treatment for conditions other than malignant neoplasm: Secondary | ICD-10-CM

## 2015-04-28 DIAGNOSIS — K648 Other hemorrhoids: Secondary | ICD-10-CM | POA: Diagnosis not present

## 2015-04-28 MED ORDER — LIDOCAINE 5 % EX OINT: 1.0000 "application " | TOPICAL_OINTMENT | Freq: Two times a day (BID) | CUTANEOUS | Status: DC | PRN

## 2015-04-28 NOTE — Patient Instructions (Signed)
Bath sitz with warm water and Epson Salts Lidocaine as needed  Drink plenty of fluids Start a high-fiber diet  Go to the ER if: Severe pain, increased swelling, fever, chills. Call   if not improving in the next few days    About Hemorrhoids  Hemorrhoids are swollen veins in the lower rectum and anus.  Also called piles, hemorrhoids are a common problem.  Hemorrhoids may be internal (inside the rectum) or external (around the anus).  Internal Hemorrhoids  Internal hemorrhoids are often painless, but they rarely cause bleeding.  The internal veins may stretch and fall down (prolapse) through the anus to the outside of the body.  The veins may then become irritated and painful.  External Hemorrhoids  External hemorrhoids can be easily seen or felt around the anal opening.  They are under the skin around the anus.  When the swollen veins are scratched or broken by straining, rubbing or wiping they sometimes bleed.  How Hemorrhoids Occur  Veins in the rectum and around the anus tend to swell under pressure.  Hemorrhoids can result from increased pressure in the veins of your anus or rectum.  Some sources of pressure are:   Straining to have a bowel movement because of constipation  Waiting too long to have a bowel movement  Coughing and sneezing often  Sitting for extended periods of time, including on the toilet  Diarrhea  Obesity  Trauma or injury to the anus  Some liver diseases  Stress  Family history of hemorrhoids  Pregnancy  Pregnant women should try to avoid becoming constipated, because they are more likely to have hemorrhoids during pregnancy.  In the last trimester of pregnancy, the enlarged uterus may press on blood vessels and causes hemorrhoids.  In addition, the strain of childbirth sometimes causes hemorrhoids after the birth.  Symptoms of Hemorrhoids  Some symptoms of hemorrhoids include:  Swelling and/or a tender lump around the anus  Itching,  mild burning and bleeding around the anus  Painful bowel movements with or without constipation  Bright red blood covering the stool, on toilet paper or in the toilet bowel.   Symptoms usually go away within a few days.  Always talk to your doctor about any bleeding to make sure it is not from some other causes.  Diagnosing and Treating Hemorrhoids  Diagnosis is made by an examination by your healthcare provider.  Special test can be performed by your doctor.    Most cases of hemorrhoids can be treated with:  High-fiber diet: Eat more high-fiber foods, which help prevent constipation.  Ask for more detailed fiber information on types and sources of fiber from your healthcare provider.  Fluids: Drink plenty of water.  This helps soften bowel movements so they are easier to pass.  Sitz baths and cold packs: Sitting in lukewarm water two or three times a day for 15 minutes cleases the anal area and may relieve discomfort.  If the water is too hot, swelling around the anus will get worse.  Placing a cloth-covered ice pack on the anus for ten minutes four times a day can also help reduce selling.  Gently pushing a prolapsed hemorrhoid back inside after the bath or ice pack can be helpful.  Medications: For mild discomfort, your healthcare provider may suggest over-the-counter pain medication or prescribe a cream or ointment for topical use.  The cream may contain witch hazel, zinc oxide or petroleum jelly.  Medicated suppositories are also a treatment option.  Always  consult your doctor before applying medications or creams.  Procedures and surgeries: There are also a number of procedures and surgeries to shrink or remove hemorrhoids in more serious cases.  Talk to your physician about these options.  You can often prevent hemorrhoids or keep them from becoming worse by maintaining a healthy lifestyle.  Eat a fiber-rich diet of fruits, vegetables and whole grains.  Also, drink plenty of water and  exercise regularly.   2007, [employer] Doc.30

## 2015-04-28 NOTE — Progress Notes (Signed)
Pre visit review using our clinic review tool, if applicable. No additional management support is needed unless otherwise documented below in the visit note. 

## 2015-04-28 NOTE — Progress Notes (Signed)
Subjective:    Patient ID: Steven Avery, male    DOB: Feb 21, 1966, 49 y.o.   MRN: [id number]  DOS:  04/28/2015 Type of visit - description : Acute visit Interval history: Symptoms started approximately 5 days ago with a soft mass at the rectal area. The area is getting slightly bigger and has developed some pain. No bleeding or itching.   Review of Systems Denies fever chills. No nausea or vomiting. No abdominal pain. Bowel movements daily, stools are normal  Past Medical History  Diagnosis Date  . Mild hyperlipidemia   . Foot drop 2012    Left, resolved  . ADD (attention deficit disorder with hyperactivity)     ? of , dx 2011, on wellbutrin  . Headache 08/30/2014    Past Surgical History  Procedure Laterality Date  . Fracture surgery      L arm, R ankle (remotely)  . Eye surgery      ?strabismus   . Nasal sinus surgery  11-2011    for chronic sinusitis    Social History   Social History  . Marital Status: Legally Separated    Spouse Name: N/A  . Number of Children: 2  . Years of Education: N/A   Occupational History  . engineer     Social History Main Topics  . Smoking status: Never Smoker   . Smokeless tobacco: Never Used  . Alcohol Use: 2.4 oz/week    4 Cans of beer per week     Comment: socially   . Drug Use: No  . Sexual Activity: Not on file   Other Topics Concern  . Not on file   Social History Narrative   divorced, 2011, lives by himself   2 grown kids, one lives in [place], works for [employer]. The other is at [employer]           Medication List       This list is accurate as of: 04/28/15 11:59 PM.  Always use your most recent med list.               acetaminophen 325 MG tablet  Commonly known as:  TYLENOL  Take 650 mg by mouth every 6 (six) hours as needed for pain.     atorvastatin 20 MG tablet  Commonly known as:  LIPITOR  Take 1 tablet (20 mg total) by mouth daily.     CONCERTA PO  Take 36 mg by mouth daily as needed.     Fish  Oil 1000 MG Caps  Take 1 capsule by mouth daily.     ibuprofen 200 MG tablet  Commonly known as:  ADVIL,MOTRIN  Take 200 mg by mouth every 6 (six) hours as needed for pain.     lidocaine 5 % ointment  Commonly known as:  XYLOCAINE  Apply 1 application topically 2 (two) times daily as needed.     multivitamin tablet  Take 1 tablet by mouth daily.           Objective:   Physical Exam BP 114/74 mmHg  Pulse 61  Temp(Src) 97.6 F (36.4 C) (Oral)  Ht 5\' 11"  (1.803 m)  Wt 175 lb 6 oz (79.55 kg)  BMI 24.47 kg/m2  SpO2 98% General:   Well developed, well nourished . NAD.  HEENT:  Normocephalic . Face symmetric, atraumatic Abdomen:  Not distended, soft, non-tender. No rebound or rigidity.  Rectal:  External abnormalities:  On the L, has a 1 cm external hemorrhoid, slightly  TTP, no erythema or swelling otherwise. Sphincter normal, prostate is nonenlarged Skin: Not pale. Not jaundice Neurologic:  alert & oriented X3.  Speech normal, gait appropriate for age and unassisted Psych--  Cognition and judgment appear intact.  Cooperative with normal attention span and concentration.  Behavior appropriate. No anxious or depressed appearing.    Assessment & Plan:   Assessment > Hyperlipidemia  ADD Left foot drop 2012, resolved  Plan  External hemorrhoid: Symptoms consistent with external hemorrhoid, ?thrombosed. last colonoscopy 01-2013. Discussed the diagnosis and treatment of hemorrhoids in detail. Discussed stat referral to see surgery but eventually agreed on conservative treatment first w/  bath sitz, lidocaine. If not better  >>> go to the ER (severe pain, swelling, fever or chills)

## 2015-04-29 DIAGNOSIS — Z09 Encounter for follow-up examination after completed treatment for conditions other than malignant neoplasm: Secondary | ICD-10-CM | POA: Insufficient documentation

## 2015-04-29 NOTE — Assessment & Plan Note (Signed)
External hemorrhoid: Symptoms consistent with external hemorrhoid, ?thrombosed. last colonoscopy 01-2013. Discussed the diagnosis and treatment of hemorrhoids in detail. Discussed stat referral to see surgery but eventually agreed on conservative treatment first w/  bath sitz, lidocaine. If not better  >>> go to the ER (severe pain, swelling, fever or chills)

## 2015-11-30 ENCOUNTER — Encounter: Payer: Self-pay | Admitting: *Deleted

## 2015-11-30 ENCOUNTER — Telehealth: Payer: Self-pay | Admitting: *Deleted

## 2015-11-30 NOTE — Telephone Encounter (Signed)
Pre-Visit Call completed with patient and chart updated.   Pre-Visit Info documented in Specialty Comments under [employer].    

## 2015-12-01 ENCOUNTER — Encounter: Payer: Self-pay | Admitting: Internal Medicine

## 2015-12-01 ENCOUNTER — Ambulatory Visit (INDEPENDENT_AMBULATORY_CARE_PROVIDER_SITE_OTHER): Payer: 59 | Admitting: Internal Medicine

## 2015-12-01 VITALS — BP 118/62 | HR 58 | Temp 97.8°F | Ht 71.0 in | Wt 188.4 lb

## 2015-12-01 DIAGNOSIS — G43909 Migraine, unspecified, not intractable, without status migrainosus: Secondary | ICD-10-CM | POA: Diagnosis not present

## 2015-12-01 DIAGNOSIS — Z0000 Encounter for general adult medical examination without abnormal findings: Secondary | ICD-10-CM

## 2015-12-01 DIAGNOSIS — E785 Hyperlipidemia, unspecified: Secondary | ICD-10-CM | POA: Diagnosis not present

## 2015-12-01 DIAGNOSIS — Z09 Encounter for follow-up examination after completed treatment for conditions other than malignant neoplasm: Secondary | ICD-10-CM

## 2015-12-01 LAB — COMPREHENSIVE METABOLIC PANEL
ALT: 46 U/L (ref 0–53)
AST: 27 U/L (ref 0–37)
Albumin: 4.1 g/dL (ref 3.5–5.2)
Alkaline Phosphatase: 59 U/L (ref 39–117)
BUN: 20 mg/dL (ref 6–23)
CALCIUM: 9.2 mg/dL (ref 8.4–10.5)
CHLORIDE: 105 meq/L (ref 96–112)
CO2: 29 meq/L (ref 19–32)
CREATININE: 1 mg/dL (ref 0.40–1.50)
GFR: 83.48 mL/min (ref >60.00)
Glucose, Bld: 106 mg/dL — ABNORMAL HIGH (ref 70–99)
POTASSIUM: 4.3 meq/L (ref 3.5–5.1)
SODIUM: 140 meq/L (ref 135–145)
Total Bilirubin: 0.5 mg/dL (ref 0.2–1.2)
Total Protein: 6.8 g/dL (ref 6.0–8.3)

## 2015-12-01 LAB — LIPID PANEL
CHOLESTEROL: 190 mg/dL (ref 0–200)
HDL: 36.4 mg/dL — ABNORMAL LOW (ref >39.00)
LDL CALC: 127 mg/dL — AB (ref 0–99)
NonHDL: 153.93
Total CHOL/HDL Ratio: 5
Triglycerides: 134 mg/dL (ref 0.0–149.0)
VLDL: 26.8 mg/dL (ref 0.0–40.0)

## 2015-12-01 LAB — HIV ANTIBODY (ROUTINE TESTING W REFLEX): HIV: NONREACTIVE

## 2015-12-01 LAB — TSH: TSH: 1.7 u[IU]/mL (ref 0.35–4.50)

## 2015-12-01 MED ORDER — SUMATRIPTAN SUCCINATE 100 MG PO TABS: 50.0000 mg | ORAL_TABLET | ORAL | Status: DC | PRN

## 2015-12-01 NOTE — Assessment & Plan Note (Signed)
Dyslipidemia: Continue Lipitor, check labs ADD: Takes Concerta sporadically, Rx elsewhere Migraines: Long history of headaches, likely migraines. Patient experimented with somebody else's Imitrex and headaches seem to respond nicely. Recommend: for  Headaches, Rest, fluids, ibuprofen and/or Imitrex. Side effects discussed. Potential interaction with Concerta noted thus won't take together. See instructions. Call or ER if he has any unusual headaches RTC one year

## 2015-12-01 NOTE — Progress Notes (Signed)
Subjective:    Patient ID: Steven Avery, male    DOB: Mar 03, 1966, 50 y.o.   MRN: [id number]  DOS:  12/01/2015 Type of visit - description : CPX Interval history: In general feeling well, continue with a sporadic headaches, similar to previous years, sometimes associated with nausea. Good response to ibuprofen. From time to time he was intense enough that he has to miss work. He try Imitrex from his girlfriend and it helped quicker than ibuprofen.   Review of Systems Constitutional: No fever. No chills. No unexplained wt changes. No unusual sweats  HEENT: No dental problems, no ear discharge, no facial swelling, no voice changes. No eye discharge, no eye  redness , no  intolerance to light   Respiratory: No wheezing , no  difficulty breathing. No cough , no mucus production  Cardiovascular: No CP, no leg swelling , no  Palpitations  GI: no nausea, no vomiting, no diarrhea , no  abdominal pain.  No blood in the stools. No dysphagia, no odynophagia    Endocrine: No polyphagia, no polyuria , no polydipsia  GU: No dysuria, gross hematuria, difficulty urinating. No urinary urgency, no frequency.  Musculoskeletal: No joint swellings or unusual aches or pains  Skin: No change in the color of the skin, palor , no  Rash  Allergic, immunologic: No environmental allergies , no  food allergies  Neurological: No dizziness no  syncope.  No diplopia, no slurred, no slurred speech, no motor deficits, no facial  Numbness  Hematological: No enlarged lymph nodes, no easy bruising , no unusual bleedings  Psychiatry: No suicidal ideas, no hallucinations, no beavior problems, no confusion.  No unusual/severe anxiety, no depression   Past Medical History  Diagnosis Date  . Mild hyperlipidemia   . Foot drop 2012    Left, resolved  . ADD (attention deficit disorder with hyperactivity)     ? of , dx 2011, on wellbutrin  . Headache 08/30/2014    Past Surgical History  Procedure Laterality  Date  . Fracture surgery      L arm, R ankle (remotely)  . Eye surgery      ?strabismus   . Nasal sinus surgery  11-2011    for chronic sinusitis    Social History   Social History  . Marital Status: Legally Separated    Spouse Name: N/A  . Number of Children: 2  . Years of Education: N/A   Occupational History  . engineer     Social History Main Topics  . Smoking status: Never Smoker   . Smokeless tobacco: Never Used  . Alcohol Use: 2.4 oz/week    4 Cans of beer per week     Comment: socially   . Drug Use: No  . Sexual Activity: Not on file   Other Topics Concern  . Not on file   Social History Narrative   divorced, 2011, lives by himself   2 grown kids, one lives in [place], works for [employer]. The other is at [employer] History  Problem Relation Age of Onset  . Diabetes Neg Hx   . Hypertension Neg Hx   . Coronary artery disease Neg Hx   . Colon cancer Neg Hx   . Hyperlipidemia Mother     M  . Prostate cancer Neg Hx   . Colon polyps Brother     brother at age 37       Medication List  This list is accurate as of: 12/01/15  4:58 PM.  Always use your most recent med list.               acetaminophen 325 MG tablet  Commonly known as:  TYLENOL  Take 650 mg by mouth every 6 (six) hours as needed for pain.     aspirin 81 MG tablet  Take 81 mg by mouth as needed for pain.     atorvastatin 20 MG tablet  Commonly known as:  LIPITOR  Take 1 tablet (20 mg total) by mouth daily.     CONCERTA PO  Take 36 mg by mouth daily as needed.     Fish Oil 1000 MG Caps  Take 1 capsule by mouth daily.     ibuprofen 200 MG tablet  Commonly known as:  ADVIL,MOTRIN  Take 200 mg by mouth every 6 (six) hours as needed for pain.     multivitamin tablet  Take 1 tablet by mouth daily.     SUMAtriptan 100 MG tablet  Commonly known as:  IMITREX  Take 0.5-1 tablets (50-100 mg total) by mouth every 2 (two) hours as needed for migraine. May repeat in 2  hours if headache persists or recurs. No more than 200 mg in a 24 hour period. No not mix with Concerta           Objective:   Physical Exam BP 118/62 mmHg  Pulse 58  Temp(Src) 97.8 F (36.6 C) (Oral)  Ht 5\' 11"  (1.803 m)  Wt 188 lb 6 oz (85.446 kg)  BMI 26.28 kg/m2  SpO2 97%  General:   Well developed, well nourished . NAD.  Neck: No  Thyromegaly  HEENT:  Normocephalic . Face symmetric, atraumatic Lungs:  CTA B Normal respiratory effort, no intercostal retractions, no accessory muscle use. Heart: RRR,  no murmur.  No pretibial edema bilaterally  Abdomen:  Not distended, soft, non-tender. No rebound or rigidity.   Skin: Exposed areas without rash. Not pale. Not jaundice Neurologic:  alert & oriented X3.  Speech normal, gait appropriate for age and unassisted Strength symmetric and appropriate for age.  Psych: Cognition and judgment appear intact.  Cooperative with normal attention span and concentration.  Behavior appropriate. No anxious or depressed appearing.    Assessment & Plan:   Assessment > Hyperlipidemia  ADD -- Concerta , rx elesewhere Migraines-- sporadic episodes, +nausea, rx imitrex 11-2015 Left foot drop 2012, resolved  PLAN: Dyslipidemia: Continue Lipitor, check labs ADD: Takes Concerta sporadically, Rx elsewhere Migraines: Long history of headaches, likely migraines. Patient experimented with somebody else's Imitrex and headaches seem to respond nicely. Recommend: for  Headaches, Rest, fluids, ibuprofen and/or Imitrex. Side effects discussed. Potential interaction with Concerta noted thus won't take together. See instructions. Call or ER if he has any unusual headaches RTC one year

## 2015-12-01 NOTE — Progress Notes (Signed)
Pre visit review using our clinic review tool, if applicable. No additional management support is needed unless otherwise documented below in the visit note. 

## 2015-12-01 NOTE — Patient Instructions (Signed)
GO TO THE LAB : Get the blood work     GO TO THE FRONT DESK Schedule your next appointment for a  physical exam in one year, fasting   Headaches: Rest Drink fluids OTC ibuprofen and/or Imitrex. Imitrex 100 mg: Half or one tablet with the onset of headache, may repeat in 2 hours. Do not take more than 200 mg of Imitrex in a 24-hour period Do not take Imitrex and Concerta within 36 hours of each other due to potential side effects  If you have any severe or unusual headaches: call or go to the  ER

## 2015-12-01 NOTE — Assessment & Plan Note (Addendum)
Td 2012 CCS: Brother dx w/  Colon polyps Pt had a cscope 01-2013, 1 polyp, next 5 years, Dr Ardis Hughs Prostate cancer screening: DRE PSA normal 2016 Diet-exercise discussed  Labs: CMP, FLP, TSH, HIV

## 2016-01-30 ENCOUNTER — Other Ambulatory Visit: Payer: Self-pay | Admitting: Internal Medicine

## 2016-07-31 ENCOUNTER — Ambulatory Visit (INDEPENDENT_AMBULATORY_CARE_PROVIDER_SITE_OTHER): Payer: 59 | Admitting: Internal Medicine

## 2016-07-31 ENCOUNTER — Encounter: Payer: Self-pay | Admitting: Internal Medicine

## 2016-07-31 VITALS — BP 124/76 | HR 73 | Temp 98.1°F | Resp 12 | Ht 71.0 in | Wt 185.1 lb

## 2016-07-31 DIAGNOSIS — J029 Acute pharyngitis, unspecified: Secondary | ICD-10-CM | POA: Diagnosis not present

## 2016-07-31 DIAGNOSIS — Z8279 Family history of other congenital malformations, deformations and chromosomal abnormalities: Secondary | ICD-10-CM

## 2016-07-31 LAB — POCT RAPID STREP A (OFFICE): Rapid Strep A Screen: POSITIVE — AB

## 2016-07-31 MED ORDER — AMOXICILLIN 500 MG PO CAPS: 1000.0000 mg | ORAL_CAPSULE | Freq: Two times a day (BID) | ORAL | 0 refills | Status: DC

## 2016-07-31 NOTE — Progress Notes (Signed)
Subjective:    Patient ID: Steven Avery, male    DOB: 24-May-1966, 51 y.o.   MRN: [id number]  DOS:  07/31/2016 Type of visit - description : Acute visit Interval history:  Symptoms started 4-5 days ago with cough, sneezing, runny nose, mild malaise. Has checked his temperature few times, as high as 100.7. Mild R ear discomfort. Also, father was diagnosed with heart disease, concerned about it, screening?.  Review of Systems Some people at work is sick but he does not know any details Mild sinus congestion and nasal discharge No Vomiting, diarrhea. No myalgias per se.   Past Medical History:  Diagnosis Date  . ADD (attention deficit disorder with hyperactivity)    ? of , dx 2011, on wellbutrin  . Foot drop 2012   Left, resolved  . Headache 08/30/2014  . Mild hyperlipidemia     Past Surgical History:  Procedure Laterality Date  . EYE SURGERY     ?strabismus   . FRACTURE SURGERY     L arm, R ankle (remotely)  . NASAL SINUS SURGERY  11-2011   for chronic sinusitis    Social History   Social History  . Marital status: Legally Separated    Spouse name: N/A  . Number of children: 2  . Years of education: N/A   Occupational History  . engineer     Social History Main Topics  . Smoking status: Never Smoker  . Smokeless tobacco: Never Used  . Alcohol use 2.4 oz/week    4 Cans of beer per week     Comment: socially   . Drug use: No  . Sexual activity: Not on file   Other Topics Concern  . Not on file   Social History Narrative   divorced, 2011, lives by himself   2 grown kids, one lives in [place], works for [employer]. The other is at [place] as of 07/31/2016   No Known Allergies     Medication List       Accurate as of 07/31/16  4:54 PM. Always use your most recent med list.          acetaminophen 325 MG tablet Commonly known as:  TYLENOL Take 650 mg by mouth every 6 (six) hours as needed for pain.   amoxicillin 500 MG  capsule Commonly known as:  AMOXIL Take 2 capsules (1,000 mg total) by mouth 2 (two) times daily.   aspirin 81 MG tablet Take 81 mg by mouth as needed for pain.   atorvastatin 20 MG tablet Commonly known as:  LIPITOR Take 1 tablet (20 mg total) by mouth daily.   CONCERTA PO Take 36 mg by mouth daily as needed.   Fish Oil 1000 MG Caps Take 1 capsule by mouth daily.   ibuprofen 200 MG tablet Commonly known as:  ADVIL,MOTRIN Take 200 mg by mouth every 6 (six) hours as needed for pain.   multivitamin tablet Take 1 tablet by mouth daily.   SUMAtriptan 100 MG tablet Commonly known as:  IMITREX Take 0.5-1 tablets (50-100 mg total) by mouth every 2 (two) hours as needed for migraine. May repeat in 2 hours if headache persists or recurs. No more than 200 mg in a 24 hour period. No not mix with Concerta          Objective:   Physical Exam BP 124/76 (BP Location: Left Arm, Patient Position: Sitting, Cuff Size: Normal)   Pulse 73  Temp 98.1 F (36.7 C) (Oral)   Resp 12   Ht 5\' 11"  (1.803 m)   Wt 185 lb 2 oz (84 kg)   SpO2 98%   BMI 25.82 kg/m  General:   Well developed, well nourished . NAD.  HEENT:  Normocephalic . Face symmetric, atraumatic. TMs: Normal. Throat, slightly red, tonsils not visualized, no asymmetry. Nose is slightly congested, sinuses no TTP Lungs:  CTA B Normal respiratory effort, no intercostal retractions, no accessory muscle use. Heart: RRR,  no murmur.  No pretibial edema bilaterally  Skin: Not pale. Not jaundice Neurologic:  alert & oriented X3.  Speech normal, gait appropriate for age and unassisted Psych--  Cognition and judgment appear intact.  Cooperative with normal attention span and concentration.  Behavior appropriate. No anxious or depressed appearing.      Assessment & Plan:   Assessment  Hyperlipidemia  ADD -- Concerta , rx elesewhere Migraines-- sporadic episodes, +nausea, rx imitrex 11-2015 Left foot drop 2012, resolved +  FH aortic bicuspid valve and aortic aneurysm (father)  PLAN: Strep throat: Patient presented with a viral syndrome and fever, throat was slightly red, strep test positive. Will treat with amoxicillin for 10 days. See instructions + FH aortic bicuspid valve and aortic aneurysm (father): The patient has no symptoms, no murmur. Literature reviewed, guidelines do rec  a echocardiogram for screening. They recommend if the ascending aorta is not well-visualized to proceed with a CTA or MRA. This was discussed with the patient, will defer echo for now due to cost but he will call me when ready.  Today, I spent more than   25 min with the patient: >50% of the time counseling regards his family history of aortic disease, multiple questions answered, also reviewed the literature

## 2016-07-31 NOTE — Patient Instructions (Addendum)
Rest, fluids , tylenol  For cough:  Take Mucinex DM twice a day as needed until better  For nasal congestion: Use OTC Nasocort or Flonase : 2 nasal sprays on each side of the nose in the morning until you feel better    Take the antibiotic as prescribed  (Amoxicillin)  Call if not gradually better over the next  10 days  Call anytime if the symptoms are severe   Next visit by May 2018, physical exam

## 2016-07-31 NOTE — Assessment & Plan Note (Signed)
Strep throat: Patient presented with a viral syndrome and fever, throat was slightly red, strep test positive. Will treat with amoxicillin for 10 days. See instructions + FH aortic bicuspid valve and aortic aneurysm (father): The patient has no symptoms, no murmur. Literature reviewed, guidelines do rec  a echocardiogram for screening. They recommend if the ascending aorta is not well-visualized to proceed with a CTA or MRA. This was discussed with the patient, will defer echo for now due to cost but he will call me when ready.

## 2016-07-31 NOTE — Progress Notes (Signed)
Pre visit review using our clinic review tool, if applicable. No additional management support is needed unless otherwise documented below in the visit note. 

## 2016-11-12 DIAGNOSIS — H6121 Impacted cerumen, right ear: Secondary | ICD-10-CM | POA: Diagnosis not present

## 2016-11-12 DIAGNOSIS — J320 Chronic maxillary sinusitis: Secondary | ICD-10-CM | POA: Diagnosis not present

## 2016-11-12 DIAGNOSIS — J322 Chronic ethmoidal sinusitis: Secondary | ICD-10-CM | POA: Diagnosis not present

## 2016-12-06 ENCOUNTER — Ambulatory Visit (INDEPENDENT_AMBULATORY_CARE_PROVIDER_SITE_OTHER): Payer: 59 | Admitting: Internal Medicine

## 2016-12-06 ENCOUNTER — Encounter: Payer: Self-pay | Admitting: Internal Medicine

## 2016-12-06 VITALS — BP 104/78 | HR 73 | Temp 97.9°F | Ht 71.0 in | Wt 181.6 lb

## 2016-12-06 DIAGNOSIS — Z0000 Encounter for general adult medical examination without abnormal findings: Secondary | ICD-10-CM | POA: Diagnosis not present

## 2016-12-06 LAB — CBC WITH DIFFERENTIAL/PLATELET
BASOS PCT: 0.4 % (ref 0.0–3.0)
Basophils Absolute: 0 10*3/uL (ref 0.0–0.1)
EOS ABS: 0.2 10*3/uL (ref 0.0–0.7)
Eosinophils Relative: 2.1 % (ref 0.0–5.0)
HCT: 45.5 % (ref 39.0–52.0)
HEMOGLOBIN: 15.4 g/dL (ref 13.0–17.0)
Lymphocytes Relative: 33.9 % (ref 12.0–46.0)
Lymphs Abs: 2.5 10*3/uL (ref 0.7–4.0)
MCHC: 33.8 g/dL (ref 30.0–36.0)
MCV: 88.5 fl (ref 78.0–100.0)
MONO ABS: 0.6 10*3/uL (ref 0.1–1.0)
Monocytes Relative: 8.7 % (ref 3.0–12.0)
Neutro Abs: 4.1 10*3/uL (ref 1.4–7.7)
Neutrophils Relative %: 54.9 % (ref 43.0–77.0)
Platelets: 262 10*3/uL (ref 150.0–400.0)
RBC: 5.15 Mil/uL (ref 4.22–5.81)
RDW: 14.1 % (ref 11.5–15.5)
WBC: 7.4 10*3/uL (ref 4.0–10.5)

## 2016-12-06 LAB — COMPREHENSIVE METABOLIC PANEL
ALT: 42 U/L (ref 0–53)
AST: 28 U/L (ref 0–37)
Albumin: 4.3 g/dL (ref 3.5–5.2)
Alkaline Phosphatase: 65 U/L (ref 39–117)
BUN: 19 mg/dL (ref 6–23)
CO2: 29 meq/L (ref 19–32)
Calcium: 9.2 mg/dL (ref 8.4–10.5)
Chloride: 105 mEq/L (ref 96–112)
Creatinine, Ser: 1.01 mg/dL (ref 0.40–1.50)
GFR: 82.2 mL/min (ref >60.00)
GLUCOSE: 100 mg/dL — AB (ref 70–99)
POTASSIUM: 4.1 meq/L (ref 3.5–5.1)
SODIUM: 141 meq/L (ref 135–145)
Total Bilirubin: 0.5 mg/dL (ref 0.2–1.2)
Total Protein: 6.9 g/dL (ref 6.0–8.3)

## 2016-12-06 LAB — PSA: PSA: 1.41 ng/mL (ref 0.10–4.00)

## 2016-12-06 LAB — LIPID PANEL
CHOL/HDL RATIO: 4
Cholesterol: 191 mg/dL (ref 0–200)
HDL: 46.3 mg/dL (ref >39.00)
LDL CALC: 119 mg/dL — AB (ref 0–99)
NONHDL: 145
Triglycerides: 130 mg/dL (ref 0.0–149.0)
VLDL: 26 mg/dL (ref 0.0–40.0)

## 2016-12-06 MED ORDER — SUMATRIPTAN SUCCINATE 100 MG PO TABS: 50.0000 mg | ORAL_TABLET | ORAL | 1 refills | Status: DC | PRN

## 2016-12-06 NOTE — Assessment & Plan Note (Signed)
--  Td 2012 --CCS: Brother dx w/  Colon polyps S/p  cscope 01-2013, 1 polyp, next 5 years, Dr Ardis Hughs --Prostate cancer screening: DRE TODAY NORMAL, CHECKING A PSA   --Diet-exercise discussed  --Labs:  CMP, FLP, CBC, PSA

## 2016-12-06 NOTE — Progress Notes (Signed)
Subjective:    Patient ID: Steven Avery, male    DOB: 1966/04/16, 51 y.o.   MRN: [id number]  DOS:  12/06/2016 Type of visit - description : CPX Interval history: No major concerns   Review of Systems     A 14 point review of systems is negative    Past Medical History:  Diagnosis Date  . ADD (attention deficit disorder with hyperactivity)    ? of , dx 2011, on wellbutrin  . Foot drop 2012   Left, resolved  . Headache 08/30/2014  . Mild hyperlipidemia     Past Surgical History:  Procedure Laterality Date  . EYE SURGERY     ?strabismus   . FRACTURE SURGERY     L arm, R ankle (remotely)  . NASAL SINUS SURGERY  11-2011   for chronic sinusitis    Social History   Social History  . Marital status: Legally Separated    Spouse name: N/A  . Number of children: 2  . Years of education: N/A   Occupational History  . engineer     Social History Main Topics  . Smoking status: Never Smoker  . Smokeless tobacco: Never Used  . Alcohol use 2.4 oz/week    4 Cans of beer per week     Comment: socially   . Drug use: No  . Sexual activity: Not on file   Other Topics Concern  . Not on file   Social History Narrative   divorced, 2011, lives w/ girlfriend and her 2 sons   2 grown kids, one lives in [place], works for [employer]. The other is at [employer] History  Problem Relation Age of Onset  . Hyperlipidemia Mother        M  . Colon polyps Brother        brother at age 23  . Heart murmur Father   . Aortic aneurysm Father   . Diabetes Neg Hx   . Hypertension Neg Hx   . Coronary artery disease Neg Hx   . Colon cancer Neg Hx   . Prostate cancer Neg Hx      Allergies as of 12/06/2016   No Known Allergies     Medication List       Accurate as of 12/06/16 11:59 PM. Always use your most recent med list.          acetaminophen 325 MG tablet Commonly known as:  TYLENOL Take 650 mg by mouth every 6 (six) hours as needed for pain.   aspirin 81 MG  tablet Take 81 mg by mouth as needed for pain.   atorvastatin 20 MG tablet Commonly known as:  LIPITOR Take 1 tablet (20 mg total) by mouth daily.   Fish Oil 1000 MG Caps Take 1 capsule by mouth daily.   ibuprofen 200 MG tablet Commonly known as:  ADVIL,MOTRIN Take 200 mg by mouth every 6 (six) hours as needed for pain.   multivitamin tablet Take 1 tablet by mouth daily.   SUMAtriptan 100 MG tablet Commonly known as:  IMITREX Take 0.5-1 tablets (50-100 mg total) by mouth every 2 (two) hours as needed for migraine. May repeat in 2 hours if headache persists or recurs. No more than 200 mg in a 24 hour period. No not mix with Concerta          Objective:   Physical Exam BP 104/78   Pulse 73   Temp 97.9 F (  36.6 C) (Oral)   Ht 5\' 11"  (1.803 m)   Wt 181 lb 9.6 oz (82.4 kg)   SpO2 98%   BMI 25.33 kg/m   General:   Well developed, well nourished . NAD.  Neck: No  thyromegaly  HEENT:  Normocephalic . Face symmetric, atraumatic Lungs:  CTA B Normal respiratory effort, no intercostal retractions, no accessory muscle use. Heart: RRR,  no murmur.  No pretibial edema bilaterally  Abdomen:  Not distended, soft, non-tender. No rebound or rigidity.   Skin: Exposed areas without rash. Not pale. Not jaundice Rectal:  External abnormalities: none. Normal sphincter tone. No rectal masses or tenderness.  Stool brown  Prostate: Prostate gland firm and smooth, no enlargement, nodularity, tenderness, mass, asymmetry or induration.  Neurologic:  alert & oriented X3.  Speech normal, gait appropriate for age and unassisted Strength symmetric and appropriate for age.  Psych: Cognition and judgment appear intact.  Cooperative with normal attention span and concentration.  Behavior appropriate. No anxious or depressed appearing.    Assessment & Plan:    Assessment  Hyperlipidemia  ADD -- used to take  Concerta , rx elesewhere Migraines-- sporadic episodes, +nausea, rx imitrex  11-2015 Left foot drop 2012, resolved + FH aortic bicuspid valve and aortic aneurysm (father)  PLAN: Hyperlipidemia: On Lipitor, check labs Migraines: Refill Imitrex. Uses it sporadically + FH aortic disease: Discussed echo, see previous entry, declined for now RTC one year

## 2016-12-06 NOTE — Patient Instructions (Signed)
GO TO THE LAB : Get the blood work     GO TO THE FRONT DESK Schedule your next appointment for a  Physical in 1 year  

## 2016-12-07 NOTE — Assessment & Plan Note (Signed)
Hyperlipidemia: On Lipitor, check labs Migraines: Refill Imitrex. Uses it sporadically + FH aortic disease: Discussed echo, see previous entry, declined for now RTC one year

## 2017-01-01 ENCOUNTER — Other Ambulatory Visit: Payer: Self-pay | Admitting: Internal Medicine

## 2017-03-05 DIAGNOSIS — J322 Chronic ethmoidal sinusitis: Secondary | ICD-10-CM | POA: Diagnosis not present

## 2017-03-05 DIAGNOSIS — H6122 Impacted cerumen, left ear: Secondary | ICD-10-CM | POA: Diagnosis not present

## 2017-03-05 DIAGNOSIS — J320 Chronic maxillary sinusitis: Secondary | ICD-10-CM | POA: Diagnosis not present

## 2017-08-27 DIAGNOSIS — H6122 Impacted cerumen, left ear: Secondary | ICD-10-CM | POA: Diagnosis not present

## 2017-08-27 DIAGNOSIS — J322 Chronic ethmoidal sinusitis: Secondary | ICD-10-CM | POA: Diagnosis not present

## 2017-08-27 DIAGNOSIS — J320 Chronic maxillary sinusitis: Secondary | ICD-10-CM | POA: Diagnosis not present

## 2017-09-03 DIAGNOSIS — J322 Chronic ethmoidal sinusitis: Secondary | ICD-10-CM | POA: Diagnosis not present

## 2017-09-03 DIAGNOSIS — J320 Chronic maxillary sinusitis: Secondary | ICD-10-CM | POA: Diagnosis not present

## 2017-09-03 DIAGNOSIS — J040 Acute laryngitis: Secondary | ICD-10-CM | POA: Diagnosis not present

## 2017-10-18 DIAGNOSIS — J019 Acute sinusitis, unspecified: Secondary | ICD-10-CM | POA: Diagnosis not present

## 2017-10-18 DIAGNOSIS — J069 Acute upper respiratory infection, unspecified: Secondary | ICD-10-CM | POA: Diagnosis not present

## 2017-11-10 DIAGNOSIS — M25512 Pain in left shoulder: Secondary | ICD-10-CM | POA: Diagnosis not present

## 2017-11-24 DIAGNOSIS — M25512 Pain in left shoulder: Secondary | ICD-10-CM | POA: Diagnosis not present

## 2017-11-24 DIAGNOSIS — M25612 Stiffness of left shoulder, not elsewhere classified: Secondary | ICD-10-CM | POA: Diagnosis not present

## 2017-11-24 DIAGNOSIS — M6281 Muscle weakness (generalized): Secondary | ICD-10-CM | POA: Diagnosis not present

## 2017-12-11 ENCOUNTER — Encounter: Payer: Self-pay | Admitting: Gastroenterology

## 2017-12-12 ENCOUNTER — Encounter: Payer: Self-pay | Admitting: Internal Medicine

## 2017-12-12 ENCOUNTER — Ambulatory Visit (INDEPENDENT_AMBULATORY_CARE_PROVIDER_SITE_OTHER): Payer: 59 | Admitting: Internal Medicine

## 2017-12-12 VITALS — BP 116/74 | HR 61 | Temp 98.1°F | Resp 16 | Ht 71.0 in | Wt 183.2 lb

## 2017-12-12 DIAGNOSIS — Z1159 Encounter for screening for other viral diseases: Secondary | ICD-10-CM | POA: Diagnosis not present

## 2017-12-12 DIAGNOSIS — Z0000 Encounter for general adult medical examination without abnormal findings: Secondary | ICD-10-CM | POA: Diagnosis not present

## 2017-12-12 LAB — LIPID PANEL
CHOLESTEROL: 207 mg/dL — AB (ref 0–200)
HDL: 51.7 mg/dL (ref >39.00)
LDL CALC: 139 mg/dL — AB (ref 0–99)
NONHDL: 155.4
Total CHOL/HDL Ratio: 4
Triglycerides: 83 mg/dL (ref 0.0–149.0)
VLDL: 16.6 mg/dL (ref 0.0–40.0)

## 2017-12-12 LAB — COMPREHENSIVE METABOLIC PANEL
ALT: 59 U/L — ABNORMAL HIGH (ref 0–53)
AST: 34 U/L (ref 0–37)
Albumin: 4.3 g/dL (ref 3.5–5.2)
Alkaline Phosphatase: 62 U/L (ref 39–117)
BUN: 17 mg/dL (ref 6–23)
CHLORIDE: 103 meq/L (ref 96–112)
CO2: 30 mEq/L (ref 19–32)
Calcium: 9.5 mg/dL (ref 8.4–10.5)
Creatinine, Ser: 1.1 mg/dL (ref 0.40–1.50)
GFR: 74.2 mL/min (ref >60.00)
GLUCOSE: 96 mg/dL (ref 70–99)
POTASSIUM: 4.4 meq/L (ref 3.5–5.1)
SODIUM: 139 meq/L (ref 135–145)
Total Bilirubin: 0.6 mg/dL (ref 0.2–1.2)
Total Protein: 7.2 g/dL (ref 6.0–8.3)

## 2017-12-12 LAB — CBC WITH DIFFERENTIAL/PLATELET
BASOS PCT: 0.4 % (ref 0.0–3.0)
Basophils Absolute: 0 10*3/uL (ref 0.0–0.1)
EOS ABS: 0.2 10*3/uL (ref 0.0–0.7)
Eosinophils Relative: 2.4 % (ref 0.0–5.0)
HCT: 45.4 % (ref 39.0–52.0)
HEMOGLOBIN: 15.4 g/dL (ref 13.0–17.0)
LYMPHS ABS: 2.5 10*3/uL (ref 0.7–4.0)
Lymphocytes Relative: 34.2 % (ref 12.0–46.0)
MCHC: 33.9 g/dL (ref 30.0–36.0)
MCV: 89.3 fl (ref 78.0–100.0)
MONO ABS: 0.7 10*3/uL (ref 0.1–1.0)
Monocytes Relative: 8.9 % (ref 3.0–12.0)
NEUTROS PCT: 54.1 % (ref 43.0–77.0)
Neutro Abs: 4 10*3/uL (ref 1.4–7.7)
Platelets: 261 10*3/uL (ref 150.0–400.0)
RBC: 5.08 Mil/uL (ref 4.22–5.81)
RDW: 14.2 % (ref 11.5–15.5)
WBC: 7.4 10*3/uL (ref 4.0–10.5)

## 2017-12-12 MED ORDER — SUMATRIPTAN SUCCINATE 100 MG PO TABS: 50.0000 mg | ORAL_TABLET | ORAL | 2 refills | Status: DC | PRN

## 2017-12-12 NOTE — Progress Notes (Signed)
Subjective:    Patient ID: Steven Avery, male    DOB: [birth year], 52 y.o.   MRN: [id number]  DOS:  12/12/2017 Type of visit - description : cpx Interval history: Doing well, no major concerns   Review of Systems  Having left shoulder problems since January, saw orthopedic surgery, doing physical therapy, ROM is decreased.  Other than above, a 14 point review of systems is negative      Past Medical History:  Diagnosis Date  . ADD (attention deficit disorder with hyperactivity)    ? of , dx 2011, on wellbutrin  . Foot drop 2012   Left, resolved  . Headache 08/30/2014  . Mild hyperlipidemia     Past Surgical History:  Procedure Laterality Date  . EYE SURGERY     ?strabismus   . FRACTURE SURGERY     L arm, R ankle (remotely)  . NASAL SINUS SURGERY  11-2011   for chronic sinusitis    Social History   Socioeconomic History  . Marital status: Legally Separated    Spouse name: Not on file  . Number of children: 2  . Years of education: Not on file  . Highest education level: Not on file  Occupational History  . Occupation: Chief Financial Officer   Social Needs  . Financial resource strain: Not on file  . Food insecurity:    Worry: Not on file    Inability: Not on file  . Transportation needs:    Medical: Not on file    Non-medical: Not on file  Tobacco Use  . Smoking status: Never Smoker  . Smokeless tobacco: Never Used  Substance and Sexual Activity  . Alcohol use: Yes    Alcohol/week: 2.4 oz    Types: 4 Cans of beer per week    Comment: socially   . Drug use: No  . Sexual activity: Not on file  Lifestyle  . Physical activity:    Days per week: Not on file    Minutes per session: Not on file  . Stress: Not on file  Relationships  . Social connections:    Talks on phone: Not on file    Gets together: Not on file    Attends religious service: Not on file    Active member of club or organization: Not on file    Attends meetings of clubs or organizations: Not on  file    Relationship status: Not on file  . Intimate partner violence:    Fear of current or ex partner: Not on file    Emotionally abused: Not on file    Physically abused: Not on file    Forced sexual activity: Not on file  Other Topics Concern  . Not on file  Social History Narrative   divorced, 2011, lives w/ girlfriend and her 2 sons (they have behavioral issues)   2 grown sons, both graduated from [place]     Family History  Problem Relation Age of Onset  . Hyperlipidemia Mother        M  . Colon polyps Brother        brother at age 79  . Heart murmur Father   . Aortic aneurysm Father   . Diabetes Neg Hx   . Hypertension Neg Hx   . Coronary artery disease Neg Hx   . Colon cancer Neg Hx   . Prostate cancer Neg Hx      Allergies as of 12/12/2017   No Known Allergies  Medication List        Accurate as of 12/12/17 11:59 PM. Always use your most recent med list.          acetaminophen 325 MG tablet Commonly known as:  TYLENOL Take 650 mg by mouth every 6 (six) hours as needed for pain.   aspirin 81 MG tablet Take 81 mg by mouth as needed for pain.   atorvastatin 20 MG tablet Commonly known as:  LIPITOR Take 1 tablet (20 mg total) by mouth daily.   Fish Oil 1000 MG Caps Take 1 capsule by mouth daily.   fluticasone 50 MCG/ACT nasal spray Commonly known as:  FLONASE Place 1 spray into both nostrils daily as needed for allergies or rhinitis.   ibuprofen 200 MG tablet Commonly known as:  ADVIL,MOTRIN Take 200 mg by mouth every 6 (six) hours as needed for pain.   multivitamin tablet Take 1 tablet by mouth daily.   SUMAtriptan 100 MG tablet Commonly known as:  IMITREX Take 0.5-1 tablets (50-100 mg total) by mouth every 2 (two) hours as needed for migraine. May repeat in 2 hours if headache persists or recurs. No more than 200 mg in a 24 hour period. No not mix with Concerta   SUPER B COMPLEX PO Take 1 tablet by mouth daily.          Objective:    Physical Exam BP 116/74 (BP Location: Left Arm, Patient Position: Sitting, Cuff Size: Small)   Pulse 61   Temp 98.1 F (36.7 C) (Oral)   Resp 16   Ht 5\' 11"  (1.803 m)   Wt 183 lb 4 oz (83.1 kg)   SpO2 97%   BMI 25.56 kg/m  General:   Well developed, well nourished . NAD.  Neck: No  thyromegaly  HEENT:  Normocephalic . Face symmetric, atraumatic Lungs:  CTA B Normal respiratory effort, no intercostal retractions, no accessory muscle use. Heart: RRR,  no murmur.  No pretibial edema bilaterally  Abdomen:  Not distended, soft, non-tender. No rebound or rigidity.   Skin: Exposed areas without rash. Not pale. Not jaundice Neurologic:  alert & oriented X3.  Speech normal, gait appropriate for age and unassisted Strength symmetric and appropriate for age.  Psych: Cognition and judgment appear intact.  Cooperative with normal attention span and concentration.  Behavior appropriate. No anxious or depressed appearing.     Assessment & Plan:   Assessment  Hyperlipidemia  ADD -- used to take  Concerta , rx elesewhere Migraines-- sporadic episodes, +nausea, rx imitrex 11-2015 Left foot drop 2012, resolved + FH aortic bicuspid valve and aortic aneurysm (father)  PLAN: Hyperlipidemia: On Lipitor, RF as needed, checking labs ADD: not an issue at this pont, on no  medications Migraines: Sporadic, refill Imitrex Stress: Having some issues at home, counseled Return to the office 1 year

## 2017-12-12 NOTE — Assessment & Plan Note (Addendum)
--  Td 2012 --CCS: Brother dx w/  Colon polyps S/p  cscope 01-2013, 1 polyp, due for a repeat colonoscopy with Dr. Ardis Hughs, patient aware and plans to proceed at some point this year --Prostate cancer screening: DRE - PSA  wnl 2018 --Diet-exercise discussed  --Labs:  Cmp, flp,cbc,tsh,hep c

## 2017-12-12 NOTE — Progress Notes (Signed)
Pre visit review using our clinic review tool, if applicable. No additional management support is needed unless otherwise documented below in the visit note. 

## 2017-12-12 NOTE — Patient Instructions (Signed)
GO TO THE LAB : Get the blood work     GO TO THE FRONT DESK Schedule your next appointment for a  Physical exam in 1 year 

## 2017-12-14 LAB — HEPATITIS C ANTIBODY
Hepatitis C Ab: NONREACTIVE
SIGNAL TO CUT-OFF: 0.01 (ref <1.00)

## 2017-12-14 NOTE — Assessment & Plan Note (Signed)
Hyperlipidemia: On Lipitor, RF as needed, checking labs ADD: not an issue at this pont, on no  medications Migraines: Sporadic, refill Imitrex Stress: Having some issues at home, counseled Return to the office 1 year

## 2018-01-25 ENCOUNTER — Other Ambulatory Visit: Payer: Self-pay | Admitting: Internal Medicine

## 2018-02-19 DIAGNOSIS — M25512 Pain in left shoulder: Secondary | ICD-10-CM | POA: Diagnosis not present

## 2018-02-21 DIAGNOSIS — M25512 Pain in left shoulder: Secondary | ICD-10-CM | POA: Diagnosis not present

## 2018-02-23 DIAGNOSIS — M25512 Pain in left shoulder: Secondary | ICD-10-CM | POA: Diagnosis not present

## 2018-03-02 DIAGNOSIS — M25512 Pain in left shoulder: Secondary | ICD-10-CM | POA: Diagnosis not present

## 2018-03-02 DIAGNOSIS — M6281 Muscle weakness (generalized): Secondary | ICD-10-CM | POA: Diagnosis not present

## 2018-03-02 DIAGNOSIS — M25612 Stiffness of left shoulder, not elsewhere classified: Secondary | ICD-10-CM | POA: Diagnosis not present

## 2018-03-12 DIAGNOSIS — M6281 Muscle weakness (generalized): Secondary | ICD-10-CM | POA: Diagnosis not present

## 2018-03-12 DIAGNOSIS — M25512 Pain in left shoulder: Secondary | ICD-10-CM | POA: Diagnosis not present

## 2018-03-12 DIAGNOSIS — M25612 Stiffness of left shoulder, not elsewhere classified: Secondary | ICD-10-CM | POA: Diagnosis not present

## 2018-03-26 DIAGNOSIS — M25612 Stiffness of left shoulder, not elsewhere classified: Secondary | ICD-10-CM | POA: Diagnosis not present

## 2018-03-26 DIAGNOSIS — M6281 Muscle weakness (generalized): Secondary | ICD-10-CM | POA: Diagnosis not present

## 2018-03-26 DIAGNOSIS — M25512 Pain in left shoulder: Secondary | ICD-10-CM | POA: Diagnosis not present

## 2018-04-16 ENCOUNTER — Encounter: Payer: Self-pay | Admitting: Gastroenterology

## 2018-05-26 ENCOUNTER — Other Ambulatory Visit: Payer: Self-pay

## 2018-05-26 ENCOUNTER — Ambulatory Visit (AMBULATORY_SURGERY_CENTER): Payer: Self-pay | Admitting: *Deleted

## 2018-05-26 VITALS — Ht 69.0 in | Wt 193.6 lb

## 2018-05-26 DIAGNOSIS — Z86010 Personal history of colonic polyps: Secondary | ICD-10-CM

## 2018-05-26 MED ORDER — PEG 3350-KCL-NABCB-NACL-NASULF 236 G PO SOLR: 4000.0000 mL | Freq: Once | ORAL | 0 refills | Status: AC

## 2018-05-26 NOTE — Progress Notes (Signed)
Patient denies any allergies to egg or soy products. Patient denies complications with anesthesia/sedation.  Patient denies oxygen use at home and denies diet medications. Patient denied information on colonoscopy.

## 2018-05-27 ENCOUNTER — Encounter: Payer: Self-pay | Admitting: Gastroenterology

## 2018-06-09 ENCOUNTER — Ambulatory Visit (AMBULATORY_SURGERY_CENTER): Payer: 59 | Admitting: Gastroenterology

## 2018-06-09 ENCOUNTER — Encounter: Payer: Self-pay | Admitting: Gastroenterology

## 2018-06-09 VITALS — BP 131/62 | HR 69 | Temp 97.5°F | Resp 18 | Ht 71.0 in | Wt 183.0 lb

## 2018-06-09 DIAGNOSIS — Z1211 Encounter for screening for malignant neoplasm of colon: Secondary | ICD-10-CM | POA: Diagnosis not present

## 2018-06-09 DIAGNOSIS — D122 Benign neoplasm of ascending colon: Secondary | ICD-10-CM

## 2018-06-09 DIAGNOSIS — Z86010 Personal history of colonic polyps: Secondary | ICD-10-CM | POA: Diagnosis not present

## 2018-06-09 MED ORDER — SODIUM CHLORIDE 0.9 % IV SOLN: 500.0000 mL | Freq: Once | INTRAVENOUS | Status: DC

## 2018-06-09 NOTE — Progress Notes (Signed)
Called to room to assist during endoscopic procedure.  Patient ID and intended procedure confirmed with present staff. Received instructions for my participation in the procedure from the performing physician.  

## 2018-06-09 NOTE — Patient Instructions (Signed)
  Thank you for allowing [place] to care for you today!  Await pathology results by mail, approximately 2 weeks.  Final recommendation for next colonoscopy will be made at that time.  Resume previous diet and medications today.  Return to normal activities tomorrow.  Handout given for polyps.    YOU HAD AN ENDOSCOPIC PROCEDURE TODAY AT [facility]:   Refer to the procedure report that was given to you for any specific questions about what was found during the examination.  If the procedure report does not answer your questions, please call your gastroenterologist to clarify.  If you requested that your care partner not be given the details of your procedure findings, then the procedure report has been included in a sealed envelope for you to review at your convenience later.  YOU SHOULD EXPECT: Some feelings of bloating in the abdomen. Passage of more gas than usual.  Walking can help get rid of the air that was put into your GI tract during the procedure and reduce the bloating. If you had a lower endoscopy (such as a colonoscopy or flexible sigmoidoscopy) you may notice spotting of blood in your stool or on the toilet paper. If you underwent a bowel prep for your procedure, you may not have a normal bowel movement for a few days.  Please Note:  You might notice some irritation and congestion in your nose or some drainage.  This is from the oxygen used during your procedure.  There is no need for concern and it should clear up in a day or so.  SYMPTOMS TO REPORT IMMEDIATELY:   Following lower endoscopy (colonoscopy or flexible sigmoidoscopy):  Excessive amounts of blood in the stool  Significant tenderness or worsening of abdominal pains  Swelling of the abdomen that is new, acute  Fever of 100F or higher   For urgent or emergent issues, a gastroenterologist can be reached at any hour by calling [phone].   DIET:  We do recommend a small meal at first, but then you  may proceed to your regular diet.  Drink plenty of fluids but you should avoid alcoholic beverages for 24 hours.  ACTIVITY:  You should plan to take it easy for the rest of today and you should NOT DRIVE or use heavy machinery until tomorrow (because of the sedation medicines used during the test).    FOLLOW UP: Our staff will call the number listed on your records the next business day following your procedure to check on you and address any questions or concerns that you may have regarding the information given to you following your procedure. If we do not reach you, we will leave a message.  However, if you are feeling well and you are not experiencing any problems, there is no need to return our call.  We will assume that you have returned to your regular daily activities without incident.  If any biopsies were taken you will be contacted by phone or by letter within the next 1-3 weeks.  Please call us at [phone] if you have not heard about the biopsies in 3 weeks.    SIGNATURES/CONFIDENTIALITY: You and/or your care partner have signed paperwork which will be entered into your electronic medical record.  These signatures attest to the fact that that the information above on your After Visit Summary has been reviewed and is understood.  Full responsibility of the confidentiality of this discharge information lies with you and/or your care-partner.

## 2018-06-09 NOTE — Op Note (Addendum)
[facility] Patient Name: Steven Avery Procedure Date: 06/09/2018 10:24 AM MRN: [id number] Endoscopist: Milus Banister , MD Age: 52 Referring MD:  Date of Birth: [birth year] Gender: Male Account #: [id number] Procedure:                Colonoscopy Indications:              High risk colon cancer surveillance: Personal                            history of colonic polyps; colonoscopy 2014 Dr.                            Ardis Hughs found one subCM adenoma Medicines:                Monitored Anesthesia Care Procedure:                Pre-Anesthesia Assessment:                           - Prior to the procedure, a History and Physical                            was performed, and patient medications and                            allergies were reviewed. The patient's tolerance of                            previous anesthesia was also reviewed. The risks                            and benefits of the procedure and the sedation                            options and risks were discussed with the patient.                            All questions were answered, and informed consent                            was obtained. Prior Anticoagulants: The patient has                            taken no previous anticoagulant or antiplatelet                            agents. ASA Grade Assessment: II - A patient with                            mild systemic disease. After reviewing the risks                            and benefits, the patient was deemed in  satisfactory condition to undergo the procedure.                           After obtaining informed consent, the colonoscope                            was passed under direct vision. Throughout the                            procedure, the patient's blood pressure, pulse, and                            oxygen saturations were monitored continuously. The                            Model CF-HQ190L [id number]) scope  was introduced                            through the anus and advanced to the the cecum,                            identified by appendiceal orifice and ileocecal                            valve. The colonoscopy was performed without                            difficulty. The patient tolerated the procedure                            well. The quality of the bowel preparation was                            good. The ileocecal valve, appendiceal orifice, and                            rectum were photographed. Scope In: 10:32:11 AM Scope Out: 10:47:00 AM Scope Withdrawal Time: 0 hours 11 minutes 34 seconds  Total Procedure Duration: 0 hours 14 minutes 49 seconds  Findings:                 A 4 mm polyp was found in the ascending colon. The                            polyp was sessile. The polyp was removed with a                            cold snare. Resection and retrieval were complete.                           The exam was otherwise without abnormality on                            direct and retroflexion views. Complications:  No immediate complications. Estimated blood loss:                            None. Estimated Blood Loss:     Estimated blood loss: none. Impression:               - One 4 mm polyp in the ascending colon, removed                            with a cold snare. Resected and retrieved.                           - The examination was otherwise normal on direct                            and retroflexion views. Recommendation:           - Patient has a contact number available for                            emergencies. The signs and symptoms of potential                            delayed complications were discussed with the                            patient. Return to normal activities tomorrow.                            Written discharge instructions were provided to the                            patient.                           - Resume  previous diet.                           - Continue present medications.                           You will receive a letter within 2-3 weeks with the                            pathology results and my final recommendations.                           If the polyp(s) is proven to be 'pre-cancerous' on                            pathology, you will need repeat colonoscopy in 5                            years. If the polyp(s) is NOT 'precancerous' on  pathology then you should repeat colon cancer                            screening in 10 years with colonoscopy without need                            for colon cancer screening by any method prior to                            then (including stool testing). Milus Banister, MD 06/09/2018 10:49:11 AM This report has been signed electronically.

## 2018-06-09 NOTE — Progress Notes (Signed)
To PACU, VSS. Report to [email or web address] 

## 2018-06-10 ENCOUNTER — Telehealth: Payer: Self-pay

## 2018-06-10 NOTE — Telephone Encounter (Signed)
Mailbox was full, not able to leave message for follow up call.

## 2018-06-10 NOTE — Telephone Encounter (Signed)
  Follow up Call-  Call back number 06/09/2018  Post procedure Call Back phone  # [phone]  Permission to leave phone message Yes  Some recent data might be hidden     Patient questions:  Do you have a fever, pain , or abdominal swelling? No. Pain Score  0 *  Have you tolerated food without any problems? Yes.    Have you been able to return to your normal activities? Yes.    Do you have any questions about your discharge instructions: Diet   No. Medications  No. Follow up visit  No.  Do you have questions or concerns about your Care? No.  Actions: * If pain score is 4 or above: No action needed, pain <4.

## 2018-06-14 ENCOUNTER — Encounter: Payer: Self-pay | Admitting: Gastroenterology

## 2018-07-09 DIAGNOSIS — J322 Chronic ethmoidal sinusitis: Secondary | ICD-10-CM | POA: Diagnosis not present

## 2018-07-09 DIAGNOSIS — J320 Chronic maxillary sinusitis: Secondary | ICD-10-CM | POA: Diagnosis not present

## 2018-07-09 DIAGNOSIS — J040 Acute laryngitis: Secondary | ICD-10-CM | POA: Diagnosis not present

## 2018-12-15 ENCOUNTER — Encounter: Payer: 59 | Admitting: Internal Medicine

## 2019-01-16 ENCOUNTER — Other Ambulatory Visit: Payer: Self-pay | Admitting: Internal Medicine

## 2019-01-19 ENCOUNTER — Other Ambulatory Visit: Payer: Self-pay

## 2019-01-19 ENCOUNTER — Ambulatory Visit (INDEPENDENT_AMBULATORY_CARE_PROVIDER_SITE_OTHER): Payer: 59 | Admitting: Internal Medicine

## 2019-01-19 ENCOUNTER — Encounter: Payer: Self-pay | Admitting: Internal Medicine

## 2019-01-19 VITALS — BP 139/92 | HR 60 | Temp 97.5°F | Resp 16 | Ht 71.0 in | Wt 184.4 lb

## 2019-01-19 DIAGNOSIS — E785 Hyperlipidemia, unspecified: Secondary | ICD-10-CM

## 2019-01-19 DIAGNOSIS — Z0000 Encounter for general adult medical examination without abnormal findings: Secondary | ICD-10-CM | POA: Diagnosis not present

## 2019-01-19 LAB — PSA: PSA: 1.1 ng/mL (ref 0.10–4.00)

## 2019-01-19 LAB — COMPREHENSIVE METABOLIC PANEL
ALT: 26 U/L (ref 0–53)
AST: 23 U/L (ref 0–37)
Albumin: 4.6 g/dL (ref 3.5–5.2)
Alkaline Phosphatase: 76 U/L (ref 39–117)
BUN: 14 mg/dL (ref 6–23)
CO2: 30 mEq/L (ref 19–32)
Calcium: 9.5 mg/dL (ref 8.4–10.5)
Chloride: 102 mEq/L (ref 96–112)
Creatinine, Ser: 1.02 mg/dL (ref 0.40–1.50)
GFR: 75.86 mL/min (ref >60.00)
Glucose, Bld: 104 mg/dL — ABNORMAL HIGH (ref 70–99)
Potassium: 4.7 mEq/L (ref 3.5–5.1)
Sodium: 139 mEq/L (ref 135–145)
Total Bilirubin: 0.8 mg/dL (ref 0.2–1.2)
Total Protein: 7.4 g/dL (ref 6.0–8.3)

## 2019-01-19 LAB — LIPID PANEL
Cholesterol: 199 mg/dL (ref 0–200)
HDL: 53.7 mg/dL (ref >39.00)
LDL Cholesterol: 126 mg/dL — ABNORMAL HIGH (ref 0–99)
NonHDL: 145.24
Total CHOL/HDL Ratio: 4
Triglycerides: 96 mg/dL (ref 0.0–149.0)
VLDL: 19.2 mg/dL (ref 0.0–40.0)

## 2019-01-19 LAB — TSH: TSH: 1.69 u[IU]/mL (ref 0.35–4.50)

## 2019-01-19 NOTE — Patient Instructions (Signed)
Get the blood work     [place] Schedule your next appointment   for a physical exam in 1 year  Get a flu shot early this season    Check the  blood pressure monthly Be sure your blood pressure is between 110/65 and  135/85. If it is consistently higher or lower, let me know

## 2019-01-19 NOTE — Assessment & Plan Note (Signed)
-  Td 2012 - rec flu shot early this year -CCS:  Brother dx w/  Colon polyps S/p  cscope 01-2013, 1 polyp, cscope 05/2018, next per GI  --Prostate cancer screening: DRE normal today, check a PSA --Diet-exercise discussed , doing well, better  --Labs: CMP, FLP, TSH, PSA

## 2019-01-19 NOTE — Progress Notes (Signed)
Subjective:    Patient ID: Steven Avery, male    DOB: Jun 25, 1966, 53 y.o.   MRN: [id number]  DOS:  01/19/2019 Type of visit - description: CPX No major concerns.  Recently had a root canal, still have some pain but no swelling no fever.   Review of Systems  Other than above, a 14 point review of systems is negative     Past Medical History:  Diagnosis Date  . ADD (attention deficit disorder with hyperactivity)    ? of , dx 2011, on wellbutrin  . Allergy   . Arthritis    shoulder  . Foot drop 2012   Left, resolved  . GERD (gastroesophageal reflux disease)    no med, diet control  . Headache 08/30/2014  . Mild hyperlipidemia     Past Surgical History:  Procedure Laterality Date  . COLONOSCOPY  2014   polyp/Jacobs  . EYE SURGERY Bilateral    ?strabismus   . FRACTURE SURGERY     L arm, R ankle (remotely)  . NASAL SINUS SURGERY  11-2011   for chronic sinusitis    Social History   Socioeconomic History  . Marital status: Legally Separated    Spouse name: Not on file  . Number of children: 2  . Years of education: Not on file  . Highest education level: Not on file  Occupational History  . Occupation: Chief Financial Officer   Social Needs  . Financial resource strain: Not on file  . Food insecurity    Worry: Not on file    Inability: Not on file  . Transportation needs    Medical: Not on file    Non-medical: Not on file  Tobacco Use  . Smoking status: Never Smoker  . Smokeless tobacco: Never Used  Substance and Sexual Activity  . Alcohol use: Yes    Alcohol/week: 5.0 standard drinks    Types: 5 Cans of beer per week  . Drug use: No  . Sexual activity: Not on file  Lifestyle  . Physical activity    Days per week: Not on file    Minutes per session: Not on file  . Stress: Not on file  Relationships  . Social Herbalist on phone: Not on file    Gets together: Not on file    Attends religious service: Not on file    Active member of club or  organization: Not on file    Attends meetings of clubs or organizations: Not on file    Relationship status: Not on file  . Intimate partner violence    Fear of current or ex partner: Not on file    Emotionally abused: Not on file    Physically abused: Not on file    Forced sexual activity: Not on file  Other Topics Concern  . Not on file  Social History Narrative   divorced, 2011, lives w/ girlfriend and her 2 sons (they have behavioral issues)   2 grown sons, both graduated from [place]     Family History  Problem Relation Age of Onset  . Hyperlipidemia Mother        M  . Colon polyps Brother        brother at age 69  . Heart murmur Father   . Aortic aneurysm Father   . Diabetes Neg Hx   . Hypertension Neg Hx   . Coronary artery disease Neg Hx   . Colon cancer Neg Hx   . Prostate  cancer Neg Hx   . Rectal cancer Neg Hx   . Stomach cancer Neg Hx      Allergies as of 01/19/2019   No Known Allergies     Medication List       Accurate as of January 19, 2019  9:24 PM. If you have any questions, ask your nurse or doctor.        acetaminophen 325 MG tablet Commonly known as: TYLENOL Take 650 mg by mouth every 6 (six) hours as needed for pain.   aspirin 81 MG tablet Take 81 mg by mouth as needed for pain.   atorvastatin 20 MG tablet Commonly known as: LIPITOR Take 1 tablet (20 mg total) by mouth daily.   bisacodyl 5 MG EC tablet Generic drug: bisacodyl Take 5 mg by mouth daily as needed for moderate constipation. Dulcolax 5 mg tab take as directed for colonoscopy prep.   Fish Oil 1000 MG Caps Take 1 capsule by mouth daily.   fluticasone 50 MCG/ACT nasal spray Commonly known as: FLONASE Place 1 spray into both nostrils daily as needed for allergies or rhinitis.   ibuprofen 200 MG tablet Commonly known as: ADVIL Take 200 mg by mouth every 6 (six) hours as needed for pain.   multivitamin tablet Take 1 tablet by mouth daily.   SUMAtriptan 100 MG tablet Commonly  known as: Imitrex Take 0.5-1 tablets (50-100 mg total) by mouth every 2 (two) hours as needed for migraine. May repeat in 2 hours if headache persists or recurs. No more than 200 mg in a 24 hour period. No not mix with Concerta   SUPER B COMPLEX PO Take 1 tablet by mouth daily.           Objective:   Physical Exam BP (!) 139/92 (BP Location: Left Arm, Patient Position: Sitting, Cuff Size: Small)   Pulse 60   Temp (!) 97.5 F (36.4 C) (Oral)   Resp 16   Ht 5\' 11"  (1.803 m)   Wt 184 lb 6 oz (83.6 kg)   SpO2 100%   BMI 25.72 kg/m  General: Well developed, NAD, BMI noted Neck: No  thyromegaly  HEENT:  Normocephalic . Face symmetric, atraumatic Lungs:  CTA B Normal respiratory effort, no intercostal retractions, no accessory muscle use. Heart: RRR,  no murmur.  No pretibial edema bilaterally  Abdomen:  Not distended, soft, non-tender. No rebound or rigidity.   Skin: Exposed areas without rash. Not pale. Not jaundice DRE: Normal sphincter tone, brown stools, normal prostate gland without enlargement or nodularity Neurologic:  alert & oriented X3.  Speech normal, gait appropriate for age and unassisted Strength symmetric and appropriate for age.  Psych: Cognition and judgment appear intact.  Cooperative with normal attention span and concentration.  Behavior appropriate. No anxious or depressed appearing.     Assessment    Assessment  Hyperlipidemia  ADD -- used to take  Concerta , rx elesewhere Migraines-- sporadic episodes, +nausea, rx imitrex 11-2015 Left foot drop 2012, resolved + FH aortic bicuspid valve and aortic aneurysm (father)  PLAN: Here for CPX Hyperlipidemia:On Lipitor, checking labs, EKG: NSR, no acute changes Migraines: From time to time uses Imitrex, no unusual, severe or frequent symptoms. Slight increase on the diastolic BP: Recommend ambulatory BPs RTC 1 year

## 2019-01-19 NOTE — Assessment & Plan Note (Signed)
Here for CPX Hyperlipidemia:On Lipitor, checking labs, EKG: NSR, no acute changes Migraines: From time to time uses Imitrex, no unusual, severe or frequent symptoms. Slight increase on the diastolic BP: Recommend ambulatory BPs RTC 1 year

## 2019-01-19 NOTE — Progress Notes (Signed)
Pre visit review using our clinic review tool, if applicable. No additional management support is needed unless otherwise documented below in the visit note. 

## 2019-02-23 ENCOUNTER — Other Ambulatory Visit: Payer: Self-pay | Admitting: Internal Medicine

## 2019-06-07 ENCOUNTER — Other Ambulatory Visit: Payer: Self-pay | Admitting: Internal Medicine

## 2019-06-07 MED ORDER — SUMATRIPTAN SUCCINATE 100 MG PO TABS: 50.0000 mg | ORAL_TABLET | ORAL | 2 refills | Status: DC | PRN

## 2019-06-07 NOTE — Telephone Encounter (Signed)
Medication Refill - Medication:SUMAtriptan (IMITREX) 100 MG tablet [id number]      Preferred Pharmacy (with phone number or street name): [facility] [address], [place] - [place]  [place] [address]  Phone: [phone] Fax: [phone]      Agent: Please be advised that RX refills may take up to 3 business days. We ask that you follow-up with your pharmacy.

## 2019-06-21 MED ORDER — SUMATRIPTAN SUCCINATE 100 MG PO TABS: 50.0000 mg | ORAL_TABLET | ORAL | 2 refills | Status: DC | PRN

## 2019-06-21 NOTE — Telephone Encounter (Signed)
Rx faxed to [facility].  

## 2019-06-21 NOTE — Telephone Encounter (Signed)
Patient checking on the status of medication below, please advise patient today.   [id number] - [place], [place]

## 2019-06-21 NOTE — Addendum Note (Signed)
Addended by: Damita Dunnings D on: 06/21/2019 12:50 PM   Modules accepted: Orders

## 2019-06-21 NOTE — Telephone Encounter (Signed)
  Rx printed awaiting MD signature 

## 2019-09-24 ENCOUNTER — Other Ambulatory Visit: Payer: Self-pay

## 2019-09-24 ENCOUNTER — Ambulatory Visit: Payer: 59 | Attending: Internal Medicine

## 2019-09-24 DIAGNOSIS — Z23 Encounter for immunization: Secondary | ICD-10-CM

## 2019-09-24 NOTE — Progress Notes (Signed)
   Covid-19 Vaccination Clinic  Name:  Steven Avery    MRN: [id number] DOB: 07/29/1965  09/24/2019  Mr. Fornash was observed post Covid-19 immunization for 15 minutes without incident. He was provided with Vaccine Information Sheet and instruction to access the V-Safe system.   Mr. Weigman was instructed to call 911 with any severe reactions post vaccine: Marland Kitchen Difficulty breathing  . Swelling of face and throat  . A fast heartbeat  . A bad rash all over body  . Dizziness and weakness

## 2019-10-26 ENCOUNTER — Ambulatory Visit: Payer: 59 | Attending: Internal Medicine

## 2019-10-26 DIAGNOSIS — Z23 Encounter for immunization: Secondary | ICD-10-CM

## 2019-10-26 NOTE — Progress Notes (Signed)
   Covid-19 Vaccination Clinic  Name:  Steven Avery    MRN: [id number] DOB: 17-Mar-1966  10/26/2019  [name] was observed post Covid-19 immunization for 15 minutes without incident. He was provided with Vaccine Information Sheet and instruction to access the V-Safe system.   [name] was instructed to call 911 with any severe reactions post vaccine: Marland Kitchen Difficulty breathing  . Swelling of face and throat  . A fast heartbeat  . A bad rash all over body  . Dizziness and weakness   Immunizations Administered    Name Date Dose VIS Date Route   Pfizer COVID-19 Vaccine 10/26/2019  9:36 AM 0.3 mL 07/02/2019 Intramuscular   Manufacturer: [employer], [employer]   Lot: [id number]   [place]: [id number]

## 2020-01-21 ENCOUNTER — Encounter: Payer: Self-pay | Admitting: Internal Medicine

## 2020-01-21 ENCOUNTER — Ambulatory Visit (INDEPENDENT_AMBULATORY_CARE_PROVIDER_SITE_OTHER): Payer: 59 | Admitting: Internal Medicine

## 2020-01-21 ENCOUNTER — Other Ambulatory Visit: Payer: Self-pay

## 2020-01-21 VITALS — BP 112/78 | HR 59 | Temp 98.2°F | Resp 18 | Ht 71.0 in | Wt 190.0 lb

## 2020-01-21 DIAGNOSIS — E785 Hyperlipidemia, unspecified: Secondary | ICD-10-CM

## 2020-01-21 DIAGNOSIS — Z0000 Encounter for general adult medical examination without abnormal findings: Secondary | ICD-10-CM | POA: Diagnosis not present

## 2020-01-21 LAB — COMPREHENSIVE METABOLIC PANEL
ALT: 38 U/L (ref 0–53)
AST: 24 U/L (ref 0–37)
Albumin: 4.6 g/dL (ref 3.5–5.2)
Alkaline Phosphatase: 85 U/L (ref 39–117)
BUN: 19 mg/dL (ref 6–23)
CO2: 30 mEq/L (ref 19–32)
Calcium: 9.7 mg/dL (ref 8.4–10.5)
Chloride: 101 mEq/L (ref 96–112)
Creatinine, Ser: 1.13 mg/dL (ref 0.40–1.50)
GFR: 67.15 mL/min (ref >60.00)
Glucose, Bld: 96 mg/dL (ref 70–99)
Potassium: 4.2 mEq/L (ref 3.5–5.1)
Sodium: 138 mEq/L (ref 135–145)
Total Bilirubin: 0.8 mg/dL (ref 0.2–1.2)
Total Protein: 7.2 g/dL (ref 6.0–8.3)

## 2020-01-21 LAB — CBC WITH DIFFERENTIAL/PLATELET
Basophils Absolute: 0 10*3/uL (ref 0.0–0.1)
Basophils Relative: 0.4 % (ref 0.0–3.0)
Eosinophils Absolute: 0.2 10*3/uL (ref 0.0–0.7)
Eosinophils Relative: 1.9 % (ref 0.0–5.0)
HCT: 46.7 % (ref 39.0–52.0)
Hemoglobin: 15.8 g/dL (ref 13.0–17.0)
Lymphocytes Relative: 30.3 % (ref 12.0–46.0)
Lymphs Abs: 2.9 10*3/uL (ref 0.7–4.0)
MCHC: 33.9 g/dL (ref 30.0–36.0)
MCV: 88.1 fl (ref 78.0–100.0)
Monocytes Absolute: 0.8 10*3/uL (ref 0.1–1.0)
Monocytes Relative: 8.5 % (ref 3.0–12.0)
Neutro Abs: 5.6 10*3/uL (ref 1.4–7.7)
Neutrophils Relative %: 58.9 % (ref 43.0–77.0)
Platelets: 260 10*3/uL (ref 150.0–400.0)
RBC: 5.29 Mil/uL (ref 4.22–5.81)
RDW: 13.7 % (ref 11.5–15.5)
WBC: 9.5 10*3/uL (ref 4.0–10.5)

## 2020-01-21 LAB — HEMOGLOBIN A1C: Hgb A1c MFr Bld: 5.2 % (ref 4.6–6.5)

## 2020-01-21 LAB — LIPID PANEL
Cholesterol: 200 mg/dL (ref 0–200)
HDL: 50 mg/dL (ref >39.00)
NonHDL: 149.6
Total CHOL/HDL Ratio: 4
Triglycerides: 208 mg/dL — ABNORMAL HIGH (ref 0.0–149.0)
VLDL: 41.6 mg/dL — ABNORMAL HIGH (ref 0.0–40.0)

## 2020-01-21 LAB — LDL CHOLESTEROL, DIRECT: Direct LDL: 118 mg/dL

## 2020-01-21 NOTE — Progress Notes (Signed)
Pre visit review using our clinic review tool, if applicable. No additional management support is needed unless otherwise documented below in the visit note. 

## 2020-01-21 NOTE — Progress Notes (Signed)
Subjective:    Patient ID: Steven Avery, male    DOB: 08-24-1965, 54 y.o.   MRN: [id number]  DOS:  01/21/2020 Type of visit - description: CPX Since the last visit a year ago he is doing well  Wt Readings from Last 3 Encounters:  01/21/20 190 lb (86.2 kg)  01/19/19 184 lb 6 oz (83.6 kg)  06/09/18 183 lb (83 kg)     Review of Systems  A 14 point review of systems is negative    Past Medical History:  Diagnosis Date  . ADD (attention deficit disorder with hyperactivity)    ? of , dx 2011, on wellbutrin  . Allergy   . Arthritis    shoulder  . Foot drop 2012   Left, resolved  . GERD (gastroesophageal reflux disease)    no med, diet control  . Headache 08/30/2014  . Mild hyperlipidemia     Past Surgical History:  Procedure Laterality Date  . COLONOSCOPY  2014   polyp/Jacobs  . EYE SURGERY Bilateral    ?strabismus   . FRACTURE SURGERY     L arm, R ankle (remotely)  . NASAL SINUS SURGERY  11-2011   for chronic sinusitis    Allergies as of 01/21/2020   No Known Allergies     Medication List       Accurate as of January 21, 2020 11:59 PM. If you have any questions, ask your nurse or doctor.        STOP taking these medications   bisacodyl 5 MG EC tablet Generic drug: bisacodyl Stopped by: Kathlene November, MD   fluticasone 50 MCG/ACT nasal spray Commonly known as: FLONASE Stopped by: Kathlene November, MD     TAKE these medications   acetaminophen 325 MG tablet Commonly known as: TYLENOL Take 650 mg by mouth every 6 (six) hours as needed for pain.   aspirin 81 MG tablet Take 81 mg by mouth as needed for pain.   atorvastatin 20 MG tablet Commonly known as: LIPITOR Take 1 tablet (20 mg total) by mouth daily.   Fish Oil 1000 MG Caps Take 1 capsule by mouth daily.   ibuprofen 200 MG tablet Commonly known as: ADVIL Take 200 mg by mouth every 6 (six) hours as needed for pain.   multivitamin tablet Take 1 tablet by mouth daily.   SUMAtriptan 100 MG tablet Commonly  known as: Imitrex Take 0.5-1 tablets (50-100 mg total) by mouth every 2 (two) hours as needed for migraine. May repeat in 2 hours if headache persists or recurs. No more than 200 mg in a 24 hour period.   SUPER B COMPLEX PO Take 1 tablet by mouth daily.          Objective:   Physical Exam BP 112/78 (BP Location: Left Arm, Patient Position: Sitting, Cuff Size: Small)   Pulse (!) 59   Temp 98.2 F (36.8 C) (Oral)   Resp 18   Ht 5\' 11"  (1.803 m)   Wt 190 lb (86.2 kg)   SpO2 98%   BMI 26.50 kg/m  General: Well developed, NAD, BMI noted Neck: No  thyromegaly  HEENT:  Normocephalic . Face symmetric, atraumatic Lungs:  CTA B Normal respiratory effort, no intercostal retractions, no accessory muscle use. Heart: RRR,  no murmur.  Abdomen:  Not distended, soft, non-tender. No rebound or rigidity.   Lower extremities: no pretibial edema bilaterally  Skin: Exposed areas without rash. Not pale. Not jaundice Neurologic:  alert & oriented X3.  Speech normal, gait appropriate for age and unassisted Strength symmetric and appropriate for age.  Psych: Cognition and judgment appear intact.  Cooperative with normal attention span and concentration.  Behavior appropriate. No anxious or depressed appearing.     Assessment     Assessment  Hyperlipidemia  ADD -- used to take  Concerta , rx elesewhere Migraines-- sporadic episodes, +nausea, rx imitrex 11-2015 Left foot drop 2012, resolved + FH aortic bicuspid valve and aortic aneurysm (father)  PLAN: Here for CPX Hyperlipidemia: Continue statin, checking labs Migraines: Not a major problem currently. RTC 1 year    This visit occurred during the SARS-CoV-2 public health emergency.  Safety protocols were in place, including screening questions prior to the visit, additional usage of staff PPE, and extensive cleaning of exam room while observing appropriate contact time as indicated for disinfecting solutions.

## 2020-01-21 NOTE — Patient Instructions (Addendum)
   GO TO THE LAB : Get the blood work     [place], [place] Come back for   A physical exam in 1 year

## 2020-01-23 NOTE — Assessment & Plan Note (Signed)
-  Td 2012 -S/p Pfizer Covid shots. - rec flu shot every year -CCS: FH brother dx w/  Colon polyps S/p  cscope 01-2013, 1 polyp, cscope 05/2018, next per GI  --Prostate cancer screening: DRE and PSA normal 2020. --Diet-exercise discussed, has gained few pounds, encourage portion control and increase physical activity --Labs: CMP, FLP, CBC, A1c

## 2020-01-23 NOTE — Assessment & Plan Note (Signed)
Here for CPX Hyperlipidemia: Continue statin, checking labs Migraines: Not a major problem currently. RTC 1 year

## 2020-01-25 ENCOUNTER — Other Ambulatory Visit: Payer: Self-pay | Admitting: Internal Medicine

## 2020-12-16 ENCOUNTER — Telehealth: Payer: 59 | Admitting: Emergency Medicine

## 2020-12-16 DIAGNOSIS — U071 COVID-19: Secondary | ICD-10-CM

## 2020-12-16 NOTE — Progress Notes (Signed)
Based on what you shared with me, I feel your condition warrants further evaluation and I recommend that you be seen in a face to face office visit.  You can schedule a video visit or be seen in-person.  Antiviral medication cannot be prescribed via the Evisit platform due to criteria needed to be met for this new COVID medication/treatment.    In the meantime, to protect others, wearing a face mask when around others, keeping your distance, frequent handwashing, and frequent cleaning of common surfaces such as door handles and counter tops is recommended.     NOTE: If you entered your credit card information for this eVisit, you will not be charged. You may see a "hold" on your card for the $35 but that hold will drop off and you will not have a charge processed.   If you are having a true medical emergency please call 911.      For an urgent face to face visit, [facility] has six urgent care centers for your convenience:     [facility] Get Driving Directions [phone] [place], [place] [address] . 8 am - 4 pm Monday - Friday    [facility]) Get Driving Directions [phone] [address] [place], [place] [address] . 8 am to 8 pm Monday-Friday . 10 am to 6 pm [facility] Get Driving Directions [phone]  [address] [place],  [place]  [address] . 8 am to 8 pm Monday-Friday . 8 am to 4 pm [facility] Get Driving Directions [phone] [place], [place], [place] [address] . 8 am to 8 pm Monday-Friday . 8 am to 4 pm [facility] Get Driving Directions  [phone] [address] [place], [place] [address] . 8 am to 8 pm Monday-Friday . 8 am to 4 pm  [facility] at [place] Get Driving Directions [phone] [address] [place], [place] [address] . 8 am to 8 pm Monday-Friday . 8 am to 4 pm Saturday-Sunday     Your MyChart E-visit questionnaire answers were reviewed by a board certified advanced clinical practitioner to complete your personal care plan based on your specific symptoms.  Thank you for using e-Visits.    Approximately 5 minutes was spent documenting and reviewing patient's chart.

## 2020-12-17 ENCOUNTER — Ambulatory Visit (HOSPITAL_COMMUNITY)
Admission: RE | Admit: 2020-12-17 | Discharge: 2020-12-17 | Disposition: A | Discharge status: Home / Self Care | Payer: 59 | Source: Ambulatory Visit

## 2020-12-17 ENCOUNTER — Encounter (HOSPITAL_COMMUNITY): Payer: Self-pay

## 2020-12-17 ENCOUNTER — Other Ambulatory Visit: Payer: Self-pay

## 2020-12-17 VITALS — BP 128/84 | HR 77 | Temp 98.2°F | Resp 18

## 2020-12-17 DIAGNOSIS — U071 COVID-19: Secondary | ICD-10-CM

## 2020-12-17 DIAGNOSIS — J069 Acute upper respiratory infection, unspecified: Secondary | ICD-10-CM

## 2020-12-17 MED ORDER — BENZONATATE 100 MG PO CAPS: 100.0000 mg | ORAL_CAPSULE | Freq: Three times a day (TID) | ORAL | 0 refills | Status: DC

## 2020-12-17 MED ORDER — FLUTICASONE PROPIONATE 50 MCG/ACT NA SUSP: 2.0000 | Freq: Every day | NASAL | 0 refills | Status: DC

## 2020-12-17 NOTE — ED Triage Notes (Signed)
Pt reports headache, nasal congestion, cough, body aches x 2 days. Pt had a positive home COVID test 1 day ago. Denies fever.

## 2021-01-16 ENCOUNTER — Other Ambulatory Visit: Payer: Self-pay | Admitting: Internal Medicine

## 2021-01-23 ENCOUNTER — Ambulatory Visit (INDEPENDENT_AMBULATORY_CARE_PROVIDER_SITE_OTHER): Payer: 59 | Admitting: Internal Medicine

## 2021-01-23 ENCOUNTER — Other Ambulatory Visit: Payer: Self-pay

## 2021-01-23 ENCOUNTER — Encounter: Payer: Self-pay | Admitting: Internal Medicine

## 2021-01-23 VITALS — BP 116/70 | HR 59 | Temp 97.8°F | Resp 16 | Ht 71.0 in | Wt 179.4 lb

## 2021-01-23 DIAGNOSIS — Z23 Encounter for immunization: Secondary | ICD-10-CM | POA: Diagnosis not present

## 2021-01-23 DIAGNOSIS — E785 Hyperlipidemia, unspecified: Secondary | ICD-10-CM | POA: Diagnosis not present

## 2021-01-23 DIAGNOSIS — Z125 Encounter for screening for malignant neoplasm of prostate: Secondary | ICD-10-CM | POA: Diagnosis not present

## 2021-01-23 DIAGNOSIS — Z0000 Encounter for general adult medical examination without abnormal findings: Secondary | ICD-10-CM

## 2021-01-23 LAB — CBC WITH DIFFERENTIAL/PLATELET
Basophils Absolute: 0 10*3/uL (ref 0.0–0.1)
Basophils Relative: 0.4 % (ref 0.0–3.0)
Eosinophils Absolute: 0.2 10*3/uL (ref 0.0–0.7)
Eosinophils Relative: 2.5 % (ref 0.0–5.0)
HCT: 43.8 % (ref 39.0–52.0)
Hemoglobin: 14.8 g/dL (ref 13.0–17.0)
Lymphocytes Relative: 32.5 % (ref 12.0–46.0)
Lymphs Abs: 2.2 10*3/uL (ref 0.7–4.0)
MCHC: 33.7 g/dL (ref 30.0–36.0)
MCV: 88.2 fl (ref 78.0–100.0)
Monocytes Absolute: 0.5 10*3/uL (ref 0.1–1.0)
Monocytes Relative: 7.6 % (ref 3.0–12.0)
Neutro Abs: 3.9 10*3/uL (ref 1.4–7.7)
Neutrophils Relative %: 57 % (ref 43.0–77.0)
Platelets: 235 10*3/uL (ref 150.0–400.0)
RBC: 4.97 Mil/uL (ref 4.22–5.81)
RDW: 14.3 % (ref 11.5–15.5)
WBC: 6.9 10*3/uL (ref 4.0–10.5)

## 2021-01-23 LAB — COMPREHENSIVE METABOLIC PANEL
ALT: 38 U/L (ref 0–53)
AST: 27 U/L (ref 0–37)
Albumin: 4.3 g/dL (ref 3.5–5.2)
Alkaline Phosphatase: 78 U/L (ref 39–117)
BUN: 20 mg/dL (ref 6–23)
CO2: 29 mEq/L (ref 19–32)
Calcium: 9 mg/dL (ref 8.4–10.5)
Chloride: 105 mEq/L (ref 96–112)
Creatinine, Ser: 0.91 mg/dL (ref 0.40–1.50)
GFR: 93.96 mL/min (ref >60.00)
Glucose, Bld: 95 mg/dL (ref 70–99)
Potassium: 4.3 mEq/L (ref 3.5–5.1)
Sodium: 139 mEq/L (ref 135–145)
Total Bilirubin: 0.7 mg/dL (ref 0.2–1.2)
Total Protein: 6.7 g/dL (ref 6.0–8.3)

## 2021-01-23 LAB — LIPID PANEL
Cholesterol: 179 mg/dL (ref 0–200)
HDL: 49 mg/dL (ref >39.00)
LDL Cholesterol: 109 mg/dL — ABNORMAL HIGH (ref 0–99)
NonHDL: 130.44
Total CHOL/HDL Ratio: 4
Triglycerides: 108 mg/dL (ref 0.0–149.0)
VLDL: 21.6 mg/dL (ref 0.0–40.0)

## 2021-01-23 LAB — PSA: PSA: 1.1 ng/mL (ref 0.10–4.00)

## 2021-01-23 NOTE — Progress Notes (Signed)
Subjective:    Patient ID: Steven Avery, male    DOB: [birth year], 55 y.o.   MRN: [id number]  DOS:  01/23/2021 Type of visit - description: CPX  Since the last office visit he is doing well. He did have mild COVID in May and he feels fully recuperated. Having problems with the elbows, sees Ortho, had local injections.  Wt Readings from Last 3 Encounters:  01/23/21 179 lb 6 oz (81.4 kg)  01/21/20 190 lb (86.2 kg)  01/19/19 184 lb 6 oz (83.6 kg)    Review of Systems  Other than above, a 14 point review of systems is negative    Past Medical History:  Diagnosis Date   ADD (attention deficit disorder with hyperactivity)    ? of , dx 2011, on wellbutrin   Allergy    Arthritis    shoulder   Foot drop 2012   Left, resolved   GERD (gastroesophageal reflux disease)    no med, diet control   Headache 08/30/2014   Mild hyperlipidemia     Past Surgical History:  Procedure Laterality Date   COLONOSCOPY  2014   polyp/Jacobs   EYE SURGERY Bilateral    ?strabismus    FRACTURE SURGERY     L arm, R ankle (remotely)   NASAL SINUS SURGERY  11-2011   for chronic sinusitis   Social History   Socioeconomic History   Marital status: Single    Spouse name: Not on file   Number of children: 2   Years of education: Not on file   Highest education level: Not on file  Occupational History   Occupation: Chief Financial Officer   Tobacco Use   Smoking status: Never   Smokeless tobacco: Never  Vaping Use   Vaping Use: Never used  Substance and Sexual Activity   Alcohol use: Yes    Alcohol/week: 5.0 standard drinks    Types: 5 Cans of beer per week   Drug use: No   Sexual activity: Not on file  Other Topics Concern   Not on file  Social History Narrative   divorced, 2011, lives w/ girlfriend and her 2 sons (they have behavioral issues)   2 grown sons, both graduated from [employer] Determinants of Health   Financial Resource Strain: Not on file  Food Insecurity: Not on file   Transportation Needs: Not on file  Physical Activity: Not on file  Stress: Not on file  Social Connections: Not on file  Intimate Partner Violence: Not on file    Allergies as of 01/23/2021   No Known Allergies      Medication List        Accurate as of January 23, 2021  9:50 PM. If you have any questions, ask your nurse or doctor.          STOP taking these medications    aspirin 81 MG tablet Stopped by: Kathlene November, MD   benzonatate 100 MG capsule Commonly known as: TESSALON Stopped by: Kathlene November, MD   fluticasone 50 MCG/ACT nasal spray Commonly known as: FLONASE Stopped by: Kathlene November, MD   ibuprofen 200 MG tablet Commonly known as: ADVIL Stopped by: Kathlene November, MD   [place] D PO Stopped by: Kathlene November, MD   [place] DM PO Stopped by: Kathlene November, MD       TAKE these medications    acetaminophen 325 MG tablet Commonly known as: TYLENOL Take 650 mg by mouth every 6 (six) hours as needed  for pain.   atorvastatin 20 MG tablet Commonly known as: LIPITOR Take 1 tablet (20 mg total) by mouth daily.   Fish Oil 1000 MG Caps Take 1 capsule by mouth daily.   multivitamin tablet Take 1 tablet by mouth daily.   naproxen 500 MG tablet Commonly known as: NAPROSYN Take 500 mg by mouth 2 (two) times daily with a meal.   SUMAtriptan 100 MG tablet Commonly known as: Imitrex Take 0.5-1 tablets (50-100 mg total) by mouth every 2 (two) hours as needed for migraine. May repeat in 2 hours if headache persists or recurs. No more than 200 mg in a 24 hour period.   SUPER B COMPLEX PO Take 1 tablet by mouth daily.           Objective:   Physical Exam BP 116/70 (BP Location: Left Arm, Patient Position: Sitting, Cuff Size: Normal)   Pulse (!) 59   Temp 97.8 F (36.6 C) (Oral)   Resp 16   Ht 5\' 11"  (1.803 m)   Wt 179 lb 6 oz (81.4 kg)   SpO2 97%   BMI 25.02 kg/m  General: Well developed, NAD, BMI noted Neck: No  thyromegaly  HEENT:  Normocephalic . Face symmetric,  atraumatic Lungs:  CTA B Normal respiratory effort, no intercostal retractions, no accessory muscle use. Heart: RRR,  no murmur.  Abdomen:  Not distended, soft, non-tender. No rebound or rigidity.   Lower extremities: no pretibial edema bilaterally DRE: Normal sphincter tone, brown stools, normal prostate Skin: Exposed areas without rash. Not pale. Not jaundice Neurologic:  alert & oriented X3.  Speech normal, gait appropriate for age and unassisted Strength symmetric and appropriate for age.  Psych: Cognition and judgment appear intact.  Cooperative with normal attention span and concentration.  Behavior appropriate. No anxious or depressed appearing.     Assessment    Assessment  Hyperlipidemia  ADD -- used to take  Concerta , rx elesewhere Migraines-- sporadic episodes, +nausea, rx imitrex 11-2015 Left foot drop 2012, resolved + FH aortic bicuspid valve and aortic aneurysm (father) Covid infex 11-2020, no covid meds rx  PLAN: Here for CPX Hyperlipidemia: On Lipitor, checking labs Elbow pain: Bilateral, sees Ortho.  Local injection helped.  Doing some physical therapy. Aspirin: Self stopped, he is on atorvastatin, do not see a need to go back on aspirin at this point. RTC 1 year   This visit occurred during the SARS-CoV-2 public health emergency.  Safety protocols were in place, including screening questions prior to the visit, additional usage of staff PPE, and extensive cleaning of exam room while observing appropriate contact time as indicated for disinfecting solutions.

## 2021-01-23 NOTE — Patient Instructions (Signed)
Today you are getting a tetanus shot  Proceed with a shingles shot as schedule  Consider a second COVID booster in the fall  Get a flu shot in the fall   GO TO THE LAB : Get the blood work     [place], [place] back for   a physical exam in 1 year

## 2021-01-23 NOTE — Assessment & Plan Note (Signed)
Here for CPX Hyperlipidemia: On Lipitor, checking labs Elbow pain: Bilateral, sees Ortho.  Local injection helped.  Doing some physical therapy. Aspirin: Self stopped, he is on atorvastatin, do not see a need to go back on aspirin at this point. RTC 1 year

## 2021-01-23 NOTE — Assessment & Plan Note (Signed)
-  Td 2012 -S/p Pfizer Covid shots x3, shot # 4 pending -shingrix #1 --- 01-05-21 - rec flu shot every year -CCS: FH brother dx w/  Colon polyps S/p  cscope 01-2013, 1 polyp, cscope 05/2018, next per GI --Prostate cancer screening: DRE normal, check PSA.  No symptoms. --Diet-exercise: Doing better, + weight loss, praised --Labs: CMP, FLP, CBC, PSA

## 2021-04-10 ENCOUNTER — Other Ambulatory Visit: Payer: Self-pay | Admitting: Internal Medicine

## 2021-07-03 ENCOUNTER — Other Ambulatory Visit: Payer: Self-pay | Admitting: Internal Medicine

## 2022-01-29 ENCOUNTER — Encounter: Payer: Self-pay | Admitting: Internal Medicine

## 2022-01-29 ENCOUNTER — Ambulatory Visit (INDEPENDENT_AMBULATORY_CARE_PROVIDER_SITE_OTHER): Payer: 59 | Admitting: Internal Medicine

## 2022-01-29 ENCOUNTER — Other Ambulatory Visit: Payer: Self-pay | Admitting: Internal Medicine

## 2022-01-29 VITALS — BP 116/78 | HR 56 | Temp 97.9°F | Resp 16 | Ht 71.0 in | Wt 180.1 lb

## 2022-01-29 DIAGNOSIS — Z0000 Encounter for general adult medical examination without abnormal findings: Secondary | ICD-10-CM | POA: Diagnosis not present

## 2022-01-29 DIAGNOSIS — E785 Hyperlipidemia, unspecified: Secondary | ICD-10-CM

## 2022-01-29 LAB — CBC WITH DIFFERENTIAL/PLATELET
Basophils Absolute: 0 10*3/uL (ref 0.0–0.1)
Basophils Relative: 0.3 % (ref 0.0–3.0)
Eosinophils Absolute: 0.2 10*3/uL (ref 0.0–0.7)
Eosinophils Relative: 2.2 % (ref 0.0–5.0)
HCT: 43.7 % (ref 39.0–52.0)
Hemoglobin: 14.6 g/dL (ref 13.0–17.0)
Lymphocytes Relative: 31.6 % (ref 12.0–46.0)
Lymphs Abs: 2.5 10*3/uL (ref 0.7–4.0)
MCHC: 33.5 g/dL (ref 30.0–36.0)
MCV: 89.7 fl (ref 78.0–100.0)
Monocytes Absolute: 0.7 10*3/uL (ref 0.1–1.0)
Monocytes Relative: 8.4 % (ref 3.0–12.0)
Neutro Abs: 4.5 10*3/uL (ref 1.4–7.7)
Neutrophils Relative %: 57.5 % (ref 43.0–77.0)
Platelets: 250 10*3/uL (ref 150.0–400.0)
RBC: 4.87 Mil/uL (ref 4.22–5.81)
RDW: 14 % (ref 11.5–15.5)
WBC: 7.9 10*3/uL (ref 4.0–10.5)

## 2022-01-29 LAB — LIPID PANEL
Cholesterol: 186 mg/dL (ref 0–200)
HDL: 54.9 mg/dL (ref >39.00)
LDL Cholesterol: 110 mg/dL — ABNORMAL HIGH (ref 0–99)
NonHDL: 130.79
Total CHOL/HDL Ratio: 3
Triglycerides: 103 mg/dL (ref 0.0–149.0)
VLDL: 20.6 mg/dL (ref 0.0–40.0)

## 2022-01-29 LAB — COMPREHENSIVE METABOLIC PANEL
ALT: 31 U/L (ref 0–53)
AST: 24 U/L (ref 0–37)
Albumin: 4.4 g/dL (ref 3.5–5.2)
Alkaline Phosphatase: 69 U/L (ref 39–117)
BUN: 16 mg/dL (ref 6–23)
CO2: 30 mEq/L (ref 19–32)
Calcium: 9.3 mg/dL (ref 8.4–10.5)
Chloride: 104 mEq/L (ref 96–112)
Creatinine, Ser: 1.08 mg/dL (ref 0.40–1.50)
GFR: 75.96 mL/min (ref >60.00)
Glucose, Bld: 98 mg/dL (ref 70–99)
Potassium: 4.7 mEq/L (ref 3.5–5.1)
Sodium: 139 mEq/L (ref 135–145)
Total Bilirubin: 0.8 mg/dL (ref 0.2–1.2)
Total Protein: 6.7 g/dL (ref 6.0–8.3)

## 2022-01-29 NOTE — Patient Instructions (Signed)
It was good to see you.  You are doing very well.  Get a flu shot this fall  GO TO THE LAB : Get the blood work     [place], [place] back for   a physical exam in 1 year

## 2022-01-29 NOTE — Progress Notes (Signed)
Subjective:    Patient ID: Steven Avery, male    DOB: 06-30-1966, 56 y.o.   MRN: [id number]  DOS:  01/29/2022 Type of visit - description: CPX  Since the last visit he is doing well and has no concerns  Wt Readings from Last 3 Encounters:  01/29/22 180 lb 2 oz (81.7 kg)  01/23/21 179 lb 6 oz (81.4 kg)  01/21/20 190 lb (86.2 kg)     Review of Systems   A 14 point review of systems is negative     Past Medical History:  Diagnosis Date   ADD (attention deficit disorder with hyperactivity)    ? of , dx 2011, on wellbutrin   Allergy    Arthritis    shoulder   Foot drop 2012   Left, resolved   GERD (gastroesophageal reflux disease)    no med, diet control   Headache 08/30/2014   Mild hyperlipidemia     Past Surgical History:  Procedure Laterality Date   COLONOSCOPY  2014   polyp/Jacobs   EYE SURGERY Bilateral    ?strabismus    FRACTURE SURGERY     L arm, R ankle (remotely)   NASAL SINUS SURGERY  11-2011   for chronic sinusitis   Social History   Socioeconomic History   Marital status: Single    Spouse name: Not on file   Number of children: 2   Years of education: Not on file   Highest education level: Not on file  Occupational History   Occupation: Chief Financial Officer   Tobacco Use   Smoking status: Never   Smokeless tobacco: Never  Vaping Use   Vaping Use: Never used  Substance and Sexual Activity   Alcohol use: Yes    Alcohol/week: 5.0 standard drinks of alcohol    Types: 5 Cans of beer per week   Drug use: No   Sexual activity: Not on file  Other Topics Concern   Not on file  Social History Narrative   divorced, 2011, lives w/ girlfriend and her 2 sons (they have behavioral issues)   2 grown sons, both graduated from [employer] Determinants of Radio broadcast assistant Strain: Not on file  Food Insecurity: Not on file  Transportation Needs: Not on file  Physical Activity: Not on file  Stress: Not on file  Social Connections: Not on file   Intimate Partner Violence: Not on file     Current Outpatient Medications  Medication Instructions   acetaminophen (TYLENOL) 650 mg, Every 6 hours PRN   atorvastatin (LIPITOR) 20 mg, Oral, Daily   B Complex-C (SUPER B COMPLEX PO) 1 tablet, Oral, Daily   Multiple Vitamin (MULTIVITAMIN) tablet 1 tablet, Daily   naproxen (NAPROSYN) 500 mg, Oral, 2 times daily PRN   Omega-3 Fatty Acids (FISH OIL) 1000 MG CAPS 1 capsule, Daily   SUMAtriptan (IMITREX) 100 MG tablet TAKE ONE-HALF TO ONE TABLET BY MOUTH EVERY TWO HOURS AS NEEDED FOR MIGRANE. MAY REPEAT IN TWO HOURS IF HEADACHE REOCCURS. DO NOT TAKE MORE THAN 200 MG IN A 24 HOUR PERIOD.       Objective:   Physical Exam BP 116/78   Pulse (!) 56   Temp 97.9 F (36.6 C) (Oral)   Resp 16   Ht '5\' 11"'$  (1.803 m)   Wt 180 lb 2 oz (81.7 kg)   SpO2 98%   BMI 25.12 kg/m  General: Well developed, NAD, BMI noted Neck: No  thyromegaly  HEENT:  Normocephalic .  Face symmetric, atraumatic Lungs:  CTA B Normal respiratory effort, no intercostal retractions, no accessory muscle use. Heart: RRR,  no murmur.  Abdomen:  Not distended, soft, non-tender. No rebound or rigidity.   Lower extremities: no pretibial edema bilaterally  Skin: Exposed areas without rash. Not pale. Not jaundice Neurologic:  alert & oriented X3.  Speech normal, gait appropriate for age and unassisted Strength symmetric and appropriate for age.  Psych: Cognition and judgment appear intact.  Cooperative with normal attention span and concentration.  Behavior appropriate. No anxious or depressed appearing.     Assessment    Assessment  Hyperlipidemia  ADD -- used to take  Concerta , rx elesewhere Migraines-- sporadic episodes, +nausea, rx imitrex 11-2015 Left foot drop 2012, resolved + FH aortic bicuspid valve and aortic aneurysm (father)  PLAN: Here for CPX High cholesterol: On atorvastatin.  Checking labs Migraines:  occasional sxs. Not a major problem at this  point. RTC 1 year

## 2022-01-29 NOTE — Telephone Encounter (Signed)
Awaiting lipid panel result

## 2022-01-29 NOTE — Assessment & Plan Note (Signed)
-  Td 2022 - Pfizer Covid : utd (bivalent 04-2021) -shingrix x2 - rec flu shot every year -CCS:  FH brother dx w/  Colon polyps S/p  cscope 01-2013, 1 polyp, cscope 05/2018, next 5 years per GI letter  --Prostate cancer screening: DRE and PSA 2022 wnl; no symptoms. --Diet-exercise: Doing well, active, watching his diet, weight stable --Labs: CMP, FLP, CBC -POA information provided

## 2022-01-29 NOTE — Assessment & Plan Note (Signed)
Here for CPX High cholesterol: On atorvastatin.  Checking labs Migraines:  occasional sxs. Not a major problem at this point. RTC 1 year

## 2022-09-03 ENCOUNTER — Other Ambulatory Visit: Payer: Self-pay | Admitting: Internal Medicine

## 2023-01-31 ENCOUNTER — Ambulatory Visit: Payer: 59 | Admitting: Internal Medicine

## 2023-01-31 ENCOUNTER — Encounter: Payer: Self-pay | Admitting: Internal Medicine

## 2023-01-31 VITALS — BP 118/76 | HR 63 | Temp 98.0°F | Resp 18 | Ht 71.0 in | Wt 185.0 lb

## 2023-01-31 DIAGNOSIS — Z125 Encounter for screening for malignant neoplasm of prostate: Secondary | ICD-10-CM | POA: Diagnosis not present

## 2023-01-31 DIAGNOSIS — Z0000 Encounter for general adult medical examination without abnormal findings: Secondary | ICD-10-CM | POA: Diagnosis not present

## 2023-01-31 DIAGNOSIS — E785 Hyperlipidemia, unspecified: Secondary | ICD-10-CM

## 2023-01-31 LAB — COMPREHENSIVE METABOLIC PANEL
ALT: 39 U/L (ref 0–53)
AST: 29 U/L (ref 0–37)
Albumin: 4.5 g/dL (ref 3.5–5.2)
Alkaline Phosphatase: 72 U/L (ref 39–117)
BUN: 18 mg/dL (ref 6–23)
CO2: 30 mEq/L (ref 19–32)
Calcium: 9.6 mg/dL (ref 8.4–10.5)
Chloride: 103 mEq/L (ref 96–112)
Creatinine, Ser: 1.03 mg/dL (ref 0.40–1.50)
GFR: 79.84 mL/min (ref >60.00)
Glucose, Bld: 94 mg/dL (ref 70–99)
Potassium: 4.2 mEq/L (ref 3.5–5.1)
Sodium: 139 mEq/L (ref 135–145)
Total Bilirubin: 0.8 mg/dL (ref 0.2–1.2)
Total Protein: 7.1 g/dL (ref 6.0–8.3)

## 2023-01-31 LAB — LIPID PANEL
Cholesterol: 210 mg/dL — ABNORMAL HIGH (ref 0–200)
HDL: 50.3 mg/dL (ref >39.00)
LDL Cholesterol: 135 mg/dL — ABNORMAL HIGH (ref 0–99)
NonHDL: 159.26
Total CHOL/HDL Ratio: 4
Triglycerides: 119 mg/dL (ref 0.0–149.0)
VLDL: 23.8 mg/dL (ref 0.0–40.0)

## 2023-01-31 LAB — CBC WITH DIFFERENTIAL/PLATELET
Basophils Absolute: 0 10*3/uL (ref 0.0–0.1)
Basophils Relative: 0.5 % (ref 0.0–3.0)
Eosinophils Absolute: 0.1 10*3/uL (ref 0.0–0.7)
Eosinophils Relative: 1.9 % (ref 0.0–5.0)
HCT: 45.8 % (ref 39.0–52.0)
Hemoglobin: 15.4 g/dL (ref 13.0–17.0)
Lymphocytes Relative: 36.5 % (ref 12.0–46.0)
Lymphs Abs: 2.6 10*3/uL (ref 0.7–4.0)
MCHC: 33.7 g/dL (ref 30.0–36.0)
MCV: 88.6 fl (ref 78.0–100.0)
Monocytes Absolute: 0.6 10*3/uL (ref 0.1–1.0)
Monocytes Relative: 8.7 % (ref 3.0–12.0)
Neutro Abs: 3.7 10*3/uL (ref 1.4–7.7)
Neutrophils Relative %: 52.4 % (ref 43.0–77.0)
Platelets: 269 10*3/uL (ref 150.0–400.0)
RBC: 5.16 Mil/uL (ref 4.22–5.81)
RDW: 14 % (ref 11.5–15.5)
WBC: 7 10*3/uL (ref 4.0–10.5)

## 2023-01-31 LAB — PSA: PSA: 1.43 ng/mL (ref 0.10–4.00)

## 2023-01-31 NOTE — Assessment & Plan Note (Signed)
Here for CPX High cholesterol: On atorvastatin.  Checking labs RTC 1 year

## 2023-01-31 NOTE — Progress Notes (Signed)
Subjective:    Patient ID: Steven Avery, male    DOB: 1965-09-05, 57 y.o.   MRN: [id number]  DOS:  01/31/2023 Type of visit - description: cpx  Here for CPX No concerns.  Review of Systems   A 14 point review of systems is negative     Past Medical History:  Diagnosis Date   ADD (attention deficit disorder with hyperactivity)    ? of , dx 2011, on wellbutrin   Allergy    Arthritis    shoulder   Foot drop 2012   Left, resolved   GERD (gastroesophageal reflux disease)    no med, diet control   Headache 08/30/2014   Mild hyperlipidemia     Past Surgical History:  Procedure Laterality Date   COLONOSCOPY  2014   polyp/Jacobs   EYE SURGERY Bilateral    ?strabismus    FRACTURE SURGERY     L arm, R ankle (remotely)   NASAL SINUS SURGERY  11-2011   for chronic sinusitis   Social History   Socioeconomic History   Marital status: Single    Spouse name: Not on file   Number of children: 2   Years of education: Not on file   Highest education level: Not on file  Occupational History   Occupation: Art gallery manager   Tobacco Use   Smoking status: Never   Smokeless tobacco: Never  Vaping Use   Vaping status: Never Used  Substance and Sexual Activity   Alcohol use: Yes    Alcohol/week: 5.0 standard drinks of alcohol    Types: 5 Cans of beer per week   Drug use: No   Sexual activity: Not on file  Other Topics Concern   Not on file  Social History Narrative   divorced, 2011, lives w/ girlfriend and her 2 sons (they have behavioral issues)   2 grown sons, both graduated from [employer]   Social Determinants of Corporate investment banker Strain: Not on file  Food Insecurity: Not on file  Transportation Needs: Not on file  Physical Activity: Not on file  Stress: Not on file  Social Connections: Not on file  Intimate Partner Violence: Not on file    Current Outpatient Medications  Medication Instructions   acetaminophen (TYLENOL) 650 mg, Every 6 hours PRN    atorvastatin (LIPITOR) 20 mg, Oral, Daily   B Complex-C (SUPER B COMPLEX PO) 1 tablet, Oral, Daily   ibuprofen (ADVIL) 200 mg, Oral, Every 6 hours PRN   Multiple Vitamin (MULTIVITAMIN) tablet 1 tablet, Daily   Omega-3 Fatty Acids (FISH OIL) 1000 MG CAPS 1 capsule, Daily   SUMAtriptan (IMITREX) 100 MG tablet TAKE ONE-HALF TO ONE TABLET BY MOUTH EVERY TWO HOURS AS NEEDED FOR MIGRANE. MAY REPEAT IN TWO HOURS IF HEADACHE REOCCURS. DO NOT TAKE MORE THAN 200 MG IN A 24 HOUR PERIOD.       Objective:   Physical Exam BP 118/76 (BP Location: Left Arm, Patient Position: Sitting, Cuff Size: Normal)   Pulse 63   Temp 98 F (36.7 C) (Temporal)   Resp 18   Ht 5\' 11"  (1.803 m)   Wt 185 lb (83.9 kg)   SpO2 98%   BMI 25.80 kg/m  General: Well developed, NAD, BMI noted Neck: No  thyromegaly  HEENT:  Normocephalic . Face symmetric, atraumatic Lungs:  CTA B Normal respiratory effort, no intercostal retractions, no accessory muscle use. Heart: RRR,  no murmur.  Abdomen:  Not distended, soft, non-tender. No rebound or  rigidity.   Lower extremities: no pretibial edema bilaterally  Skin: Exposed areas without rash. Not pale. Not jaundice Neurologic:  alert & oriented X3.  Speech normal, gait appropriate for age and unassisted Strength symmetric and appropriate for age.  Psych: Cognition and judgment appear intact.  Cooperative with normal attention span and concentration.  Behavior appropriate. No anxious or depressed appearing.     Assessment    Assessment  Hyperlipidemia  ADD -- used to take  Concerta , rx elesewhere Migraines-- sporadic episodes, +nausea, rx imitrex 11-2015 Left foot drop 2012, resolved + FH aortic bicuspid valve and aortic aneurysm (father)  PLAN: Here for CPX -Td 2022 -shingrix x2 - covid vax 04-2022  -Vaccines I recommend:   flu shot this fall -CCS:  FH brother dx w/  Colon polyps S/p  cscope 01-2013, 1 polyp, cscope 05/2018, next due November 2024, patient  aware.  Recommend to reach out to GI. --Prostate cancer screening: Check PSA --Diet-exercise: Diet is relatively healthy, tips provided.  Encouraged exercise 3 hours a week --Labs: See orders  High cholesterol: On atorvastatin.  Checking labs RTC 1 year

## 2023-01-31 NOTE — Assessment & Plan Note (Signed)
-  Td 2022 -shingrix x2 - covid vax 04-2022  -Vaccines I recommend:   flu shot this fall -CCS:  FH brother dx w/  Colon polyps S/p  cscope 01-2013, 1 polyp, cscope 05/2018, next due November 2024, patient aware.  Recommend to reach out to GI. --Prostate cancer screening: Check PSA --Diet-exercise: Diet is relatively healthy, tips provided.  Encouraged exercise 3 hours a week --Labs: See orders

## 2023-01-31 NOTE — Patient Instructions (Signed)
Call GI to schedule a colonoscopy  [phone]   Vaccines I recommend:   flu shot this fall    GO TO THE LAB : Get the blood work     GO TO THE FRONT DESK, PLEASE SCHEDULE YOUR APPOINTMENTS Come back for  a physical exam in 1 year

## 2023-03-07 ENCOUNTER — Other Ambulatory Visit: Payer: Self-pay | Admitting: Internal Medicine

## 2024-02-04 ENCOUNTER — Encounter: Payer: Self-pay | Admitting: Internal Medicine

## 2024-02-04 ENCOUNTER — Ambulatory Visit: Payer: 59 | Admitting: Internal Medicine

## 2024-02-04 VITALS — BP 120/80 | HR 61 | Temp 97.9°F | Resp 16 | Ht 71.0 in | Wt 186.2 lb

## 2024-02-04 DIAGNOSIS — Z1211 Encounter for screening for malignant neoplasm of colon: Secondary | ICD-10-CM

## 2024-02-04 DIAGNOSIS — Z125 Encounter for screening for malignant neoplasm of prostate: Secondary | ICD-10-CM | POA: Diagnosis not present

## 2024-02-04 DIAGNOSIS — E785 Hyperlipidemia, unspecified: Secondary | ICD-10-CM | POA: Diagnosis not present

## 2024-02-04 DIAGNOSIS — Z0000 Encounter for general adult medical examination without abnormal findings: Secondary | ICD-10-CM | POA: Diagnosis not present

## 2024-02-04 LAB — CBC WITH DIFFERENTIAL/PLATELET
Basophils Absolute: 0 K/uL (ref 0.0–0.1)
Basophils Relative: 0.4 % (ref 0.0–3.0)
Eosinophils Absolute: 0.2 K/uL (ref 0.0–0.7)
Eosinophils Relative: 2.3 % (ref 0.0–5.0)
HCT: 46.5 % (ref 39.0–52.0)
Hemoglobin: 15.6 g/dL (ref 13.0–17.0)
Lymphocytes Relative: 29.4 % (ref 12.0–46.0)
Lymphs Abs: 2.2 K/uL (ref 0.7–4.0)
MCHC: 33.6 g/dL (ref 30.0–36.0)
MCV: 89.1 fl (ref 78.0–100.0)
Monocytes Absolute: 0.6 K/uL (ref 0.1–1.0)
Monocytes Relative: 8.2 % (ref 3.0–12.0)
Neutro Abs: 4.4 K/uL (ref 1.4–7.7)
Neutrophils Relative %: 59.7 % (ref 43.0–77.0)
Platelets: 297 K/uL (ref 150.0–400.0)
RBC: 5.22 Mil/uL (ref 4.22–5.81)
RDW: 14 % (ref 11.5–15.5)
WBC: 7.3 K/uL (ref 4.0–10.5)

## 2024-02-04 LAB — COMPREHENSIVE METABOLIC PANEL WITH GFR
ALT: 40 U/L (ref 0–53)
AST: 28 U/L (ref 0–37)
Albumin: 4.5 g/dL (ref 3.5–5.2)
Alkaline Phosphatase: 83 U/L (ref 39–117)
BUN: 16 mg/dL (ref 6–23)
CO2: 30 meq/L (ref 19–32)
Calcium: 9.3 mg/dL (ref 8.4–10.5)
Chloride: 103 meq/L (ref 96–112)
Creatinine, Ser: 1.05 mg/dL (ref 0.40–1.50)
GFR: 77.47 mL/min (ref >60.00)
Glucose, Bld: 94 mg/dL (ref 70–99)
Potassium: 4.6 meq/L (ref 3.5–5.1)
Sodium: 141 meq/L (ref 135–145)
Total Bilirubin: 0.6 mg/dL (ref 0.2–1.2)
Total Protein: 7 g/dL (ref 6.0–8.3)

## 2024-02-04 LAB — LIPID PANEL
Cholesterol: 209 mg/dL — ABNORMAL HIGH (ref 0–200)
HDL: 54.9 mg/dL (ref >39.00)
LDL Cholesterol: 133 mg/dL — ABNORMAL HIGH (ref 0–99)
NonHDL: 154.26
Total CHOL/HDL Ratio: 4
Triglycerides: 105 mg/dL (ref 0.0–149.0)
VLDL: 21 mg/dL (ref 0.0–40.0)

## 2024-02-04 LAB — PSA: PSA: 1.45 ng/mL (ref 0.10–4.00)

## 2024-02-04 LAB — TSH: TSH: 1.91 u[IU]/mL (ref 0.35–5.50)

## 2024-02-04 NOTE — Patient Instructions (Addendum)
 Reach out to [facility] gastroenterology for a colonoscopy. [phone]  Vaccines I recommend Flu shot every fall COVID booster.     GO TO THE LAB :  Get the blood work   Your results will be posted on MyChart with my comments  Next office visit for a physical exam in 1 year Please make an appointment before you leave today

## 2024-02-04 NOTE — Progress Notes (Unsigned)
   Subjective:    Patient ID: Steven Avery, male    DOB: 1965-08-13, 58 y.o.   MRN: [id number]  DOS:  02/04/2024 Type of visit - description: CPX  CPX, doing well  Stress related to his wife's health   Review of Systems See above   Past Medical History:  Diagnosis Date   ADD (attention deficit disorder with hyperactivity)    ? of , dx 2011, on wellbutrin   Allergy    Arthritis    shoulder   Foot drop 2012   Left, resolved   GERD (gastroesophageal reflux disease)    no med, diet control   Headache 08/30/2014   Mild hyperlipidemia     Past Surgical History:  Procedure Laterality Date   COLONOSCOPY  2014   polyp/Jacobs   EYE SURGERY Bilateral    ?strabismus    FRACTURE SURGERY     L arm, R ankle (remotely)   NASAL SINUS SURGERY  11-2011   for chronic sinusitis    Current Outpatient Medications  Medication Instructions   acetaminophen (TYLENOL) 650 mg, Every 6 hours PRN   atorvastatin  (LIPITOR) 20 mg, Oral, Daily   B Complex-C (SUPER B COMPLEX PO) 1 tablet, Daily   ibuprofen (ADVIL) 200 mg, Every 6 hours PRN   Multiple Vitamin (MULTIVITAMIN) tablet 1 tablet, Daily   Omega-3 Fatty Acids (FISH OIL) 1000 MG CAPS 1 capsule, Daily   SUMAtriptan  (IMITREX ) 100 MG tablet TAKE ONE-HALF TO ONE TABLET BY MOUTH EVERY TWO HOURS AS NEEDED FOR MIGRANE. MAY REPEAT IN TWO HOURS IF HEADACHE REOCCURS. DO NOT TAKE MORE THAN 200 MG IN A 24 HOUR PERIOD.       Objective:   Physical Exam BP 120/80   Pulse 61   Temp 97.9 F (36.6 C) (Oral)   Resp 16   Ht 5' 11 (1.803 m)   Wt 186 lb 4 oz (84.5 kg)   SpO2 97%   BMI 25.98 kg/m  General: Well developed, NAD, BMI noted Neck: No  thyromegaly  HEENT:  Normocephalic . Face symmetric, atraumatic Lungs:  CTA B Normal respiratory effort, no intercostal retractions, no accessory muscle use. Heart: RRR,  no murmur.  Abdomen:  Not distended, soft, non-tender. No rebound or rigidity.   Lower extremities: no pretibial edema bilaterally   Skin: Exposed areas without rash. Not pale. Not jaundice Neurologic:  alert & oriented X3.  Speech normal, gait appropriate for age and unassisted Strength symmetric and appropriate for age.  Psych: Cognition and judgment appear intact.  Cooperative with normal attention span and concentration.  Behavior appropriate. No anxious or depressed appearing.     Assessment    Assessment  Hyperlipidemia  ADD -- used to take  Concerta , rx elesewhere Migraines-- sporadic episodes, +nausea, rx imitrex  11-2015 Left foot drop 2012, resolved + FH aortic bicuspid valve and aortic aneurysm (father)  PLAN: Here for CPX -Td 2022 -shingrix x2 - covid vax 04-2022  -Vaccines I recommend:   flu shot this fall, COVID booster -CCS:  FH brother dx w/  Colon polyps S/p  cscope 01-2013, 1 polyp, cscope 05/2018, next colonoscopy overdue.  GI referral sent. --Prostate cancer screening: Check PSA --Diet-exercise: Discussed healthy lifestyle.--Labs: CMP FLP CBC TSH PSA  Other issues Hyperlipidemia: On atorvastatin , checking labs. Migraines: Hardly ever has symptoms. Stress: Wife has been diagnosed with ALS, kidney failure, RTC 1 year   High cholesterol: On atorvastatin .  Checking labs RTC 1 year

## 2024-02-05 ENCOUNTER — Encounter: Payer: Self-pay | Admitting: Internal Medicine

## 2024-02-05 NOTE — Assessment & Plan Note (Signed)
 Here for CPX   Other issues Hyperlipidemia: On atorvastatin , checking labs. Migraines: Hardly ever has symptoms. Stress: d/t wife's health (ALS, kidney failure).  Patient handling stress well RTC 1 year

## 2024-02-05 NOTE — Assessment & Plan Note (Signed)
 Here for CPX -Td 2022 -shingrix x2 - covid vax 04-2022  -Vaccines I recommend:   flu shot this fall, COVID booster -CCS:  FH brother dx w/  Colon polyps S/p  cscope 01-2013, 1 polyp, cscope 05/2018, next colonoscopy overdue.  GI referral sent. --Prostate cancer screening: Check PSA --Diet-exercise: Discussed healthy lifestyle.--Labs: CMP FLP CBC TSH PSA

## 2024-02-07 ENCOUNTER — Ambulatory Visit: Payer: Self-pay | Admitting: Internal Medicine

## 2024-02-23 ENCOUNTER — Other Ambulatory Visit: Payer: Self-pay | Admitting: Internal Medicine

## 2024-03-31 ENCOUNTER — Encounter: Payer: Self-pay | Admitting: Internal Medicine

## 2025-02-07 ENCOUNTER — Encounter: Admitting: Internal Medicine
